# Patient Record
Sex: Female | Born: 2000
Health system: Southern US, Community
[De-identification: ages and names within clinical notes are randomized; demographics above are authoritative.]

## PROBLEM LIST (undated history)

## (undated) DIAGNOSIS — Z789 Other specified health status: Secondary | ICD-10-CM

## (undated) HISTORY — DX: Other specified health status: Z78.9

## (undated) HISTORY — PX: NO PAST SURGERIES: SHX2092

---
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
## 2018-01-23 ENCOUNTER — Telehealth: Payer: Self-pay | Admitting: Obstetrics & Gynecology

## 2018-01-23 NOTE — Telephone Encounter (Signed)
Patient states she is not spotting and wants to know if that is normal. Informed patient that no spotting if good. Verbalized understanding.

## 2018-01-29 ENCOUNTER — Telehealth: Payer: Self-pay | Admitting: *Deleted

## 2018-01-29 NOTE — Telephone Encounter (Signed)
Patient asking if she can take tylenol. Informed that is fine. Also states she is vomiting up yellow stuff. Informed patient that is bile acids from her stomach probably due to the fact her stomach is empty.  Verbalized understanding.

## 2018-02-07 ENCOUNTER — Encounter: Payer: Self-pay | Admitting: Adult Health

## 2018-02-07 ENCOUNTER — Other Ambulatory Visit: Payer: Self-pay

## 2018-02-07 ENCOUNTER — Ambulatory Visit (INDEPENDENT_AMBULATORY_CARE_PROVIDER_SITE_OTHER): Payer: BLUE CROSS/BLUE SHIELD | Admitting: Adult Health

## 2018-02-07 VITALS — BP 98/64 | HR 87 | Ht 63.0 in | Wt 110.0 lb

## 2018-02-07 DIAGNOSIS — Z3201 Encounter for pregnancy test, result positive: Secondary | ICD-10-CM | POA: Diagnosis not present

## 2018-02-07 DIAGNOSIS — Z3A09 9 weeks gestation of pregnancy: Secondary | ICD-10-CM

## 2018-02-07 DIAGNOSIS — N926 Irregular menstruation, unspecified: Secondary | ICD-10-CM | POA: Diagnosis not present

## 2018-02-07 DIAGNOSIS — O3680X0 Pregnancy with inconclusive fetal viability, not applicable or unspecified: Secondary | ICD-10-CM | POA: Insufficient documentation

## 2018-02-07 LAB — POCT URINE PREGNANCY: PREG TEST UR: POSITIVE — AB

## 2018-02-07 MED ORDER — PRENATAL PLUS IRON 29-1 MG PO TABS: ORAL_TABLET | ORAL | 12 refills | Status: DC

## 2018-02-07 NOTE — Progress Notes (Signed)
Subjective:     Patient ID: Lindsay Blackwell, female   DOB: 2000/12/11, 17 y.o.   MRN: [id number]  HPI Lindsay Blackwell is a 17 year old Hispanic female in for UPT, has missed a period.   Review of Systems +missed period Reviewed past medical,surgical, social and family history. Reviewed medications and allergies.     Objective:   Physical Exam BP 98/64 (BP Location: Right Arm, Patient Position: Sitting, Cuff Size: Normal)   Pulse 87   Ht 5\' 3"  (1.6 m)   Wt 110 lb (49.9 kg)   LMP 12/05/2017   BMI 19.49 kg/m UPT +, about 9+1 week by LMP with EDD 09/11/18.Skin warm and dry. Neck: mid line trachea, normal thyroid, good ROM, no lymphadenopathy noted. Lungs: clear to ausculation bilaterally. Cardiovascular: regular rate and rhythm.   Abdomen is soft and non tender, pt aware babies delivered at [facility] and about after hours call service.   Assessment:     1. Positive pregnancy test   2. [redacted] weeks gestation of pregnancy   3. Encounter to determine fetal viability of pregnancy, single or unspecified fetus       Plan:      Meds ordered this encounter  Medications  . Prenatal Vit-Iron Carbonyl-FA (PRENATAL PLUS IRON) 29-1 MG TABS    Sig: Take daily    Dispense:  30 tablet    Refill:  12    Order Specific Question:   Supervising Provider    Answer:   Florian Buff [[id number]]  Return in 1 week for dating [place] Review handouts on First trimester and by [facility]

## 2018-02-07 NOTE — Patient Instructions (Signed)
First Trimester of Pregnancy The first trimester of pregnancy is from week 1 until the end of week 13 (months 1 through 3). A week after a sperm fertilizes an egg, the egg will implant on the wall of the uterus. This embryo will begin to develop into a baby. Genes from you and your partner will form the baby. The female genes will determine whether the baby will be a boy or a girl. At 6-8 weeks, the eyes and face will be formed, and the heartbeat can be seen on ultrasound. At the end of 12 weeks, all the baby's organs will be formed. Now that you are pregnant, you will want to do everything you can to have a healthy baby. Two of the most important things are to get good prenatal care and to follow your health care provider's instructions. Prenatal care is all the medical care you receive before the baby's birth. This care will help prevent, find, and treat any problems during the pregnancy and childbirth. Body changes during your first trimester Your body goes through many changes during pregnancy. The changes vary from woman to woman.  You may gain or lose a couple of pounds at first.  You may feel sick to your stomach (nauseous) and you may throw up (vomit). If the vomiting is uncontrollable, call your health care provider.  You may tire easily.  You may develop headaches that can be relieved by medicines. All medicines should be approved by your health care provider.  You may urinate more often. Painful urination may mean you have a bladder infection.  You may develop heartburn as a result of your pregnancy.  You may develop constipation because certain hormones are causing the muscles that push stool through your intestines to slow down.  You may develop hemorrhoids or swollen veins (varicose veins).  Your breasts may begin to grow larger and become tender. Your nipples may stick out more, and the tissue that surrounds them (areola) may become darker.  Your gums may bleed and may be  sensitive to brushing and flossing.  Dark spots or blotches (chloasma, mask of pregnancy) may develop on your face. This will likely fade after the baby is born.  Your menstrual periods will stop.  You may have a loss of appetite.  You may develop cravings for certain kinds of food.  You may have changes in your emotions from day to day, such as being excited to be pregnant or being concerned that something may go wrong with the pregnancy and baby.  You may have more vivid and strange dreams.  You may have changes in your hair. These can include thickening of your hair, rapid growth, and changes in texture. Some women also have hair loss during or after pregnancy, or hair that feels dry or thin. Your hair will most likely return to normal after your baby is born.  What to expect at prenatal visits During a routine prenatal visit:  You will be weighed to make sure you and the baby are growing normally.  Your blood pressure will be taken.  Your abdomen will be measured to track your baby's growth.  The fetal heartbeat will be listened to between weeks 10 and 14 of your pregnancy.  Test results from any previous visits will be discussed.  Your health care provider may ask you:  How you are feeling.  If you are feeling the baby move.  If you have had any abnormal symptoms, such as leaking fluid, bleeding, severe headaches,   or abdominal cramping.  If you are using any tobacco products, including cigarettes, chewing tobacco, and electronic cigarettes.  If you have any questions.  Other tests that may be performed during your first trimester include:  Blood tests to find your blood type and to check for the presence of any previous infections. The tests will also be used to check for low iron levels (anemia) and protein on red blood cells (Rh antibodies). Depending on your risk factors, or if you previously had diabetes during pregnancy, you may have tests to check for high blood  sugar that affects pregnant women (gestational diabetes).  Urine tests to check for infections, diabetes, or protein in the urine.  An ultrasound to confirm the proper growth and development of the baby.  Fetal screens for spinal cord problems (spina bifida) and Down syndrome.  HIV (human immunodeficiency virus) testing. Routine prenatal testing includes screening for HIV, unless you choose not to have this test.  You may need other tests to make sure you and the baby are doing well.  Follow these instructions at home: Medicines  Follow your health care provider's instructions regarding medicine use. Specific medicines may be either safe or unsafe to take during pregnancy.  Take a prenatal vitamin that contains at least 600 micrograms (mcg) of folic acid.  If you develop constipation, try taking a stool softener if your health care provider approves. Eating and drinking  Eat a balanced diet that includes fresh fruits and vegetables, whole grains, good sources of protein such as meat, eggs, or tofu, and low-fat dairy. Your health care provider will help you determine the amount of weight gain that is right for you.  Avoid raw meat and uncooked cheese. These carry germs that can cause birth defects in the baby.  Eating four or five small meals rather than three large meals a day may help relieve nausea and vomiting. If you start to feel nauseous, eating a few soda crackers can be helpful. Drinking liquids between meals, instead of during meals, also seems to help ease nausea and vomiting.  Limit foods that are high in fat and processed sugars, such as fried and sweet foods.  To prevent constipation: ? Eat foods that are high in fiber, such as fresh fruits and vegetables, whole grains, and beans. ? Drink enough fluid to keep your urine clear or pale yellow. Activity  Exercise only as directed by your health care provider. Most women can continue their usual exercise routine during  pregnancy. Try to exercise for 30 minutes at least 5 days a week. Exercising will help you: ? Control your weight. ? Stay in shape. ? Be prepared for labor and delivery.  Experiencing pain or cramping in the lower abdomen or lower back is a good sign that you should stop exercising. Check with your health care provider before continuing with normal exercises.  Try to avoid standing for long periods of time. Move your legs often if you must stand in one place for a long time.  Avoid heavy lifting.  Wear low-heeled shoes and practice good posture.  You may continue to have sex unless your health care provider tells you not to. Relieving pain and discomfort  Wear a good support bra to relieve breast tenderness.  Take warm sitz baths to soothe any pain or discomfort caused by hemorrhoids. Use hemorrhoid cream if your health care provider approves.  Rest with your legs elevated if you have leg cramps or low back pain.  If you develop   varicose veins in your legs, wear support hose. Elevate your feet for 15 minutes, 3-4 times a day. Limit salt in your diet. Prenatal care  Schedule your prenatal visits by the twelfth week of pregnancy. They are usually scheduled monthly at first, then more often in the last 2 months before delivery.  Write down your questions. Take them to your prenatal visits.  Keep all your prenatal visits as told by your health care provider. This is important. Safety  Wear your seat belt at all times when driving.  Make a list of emergency phone numbers, including numbers for family, friends, the hospital, and police and fire departments. General instructions  Ask your health care provider for a referral to a local prenatal education class. Begin classes no later than the beginning of month 6 of your pregnancy.  Ask for help if you have counseling or nutritional needs during pregnancy. Your health care provider can offer advice or refer you to specialists for help  with various needs.  Do not use hot tubs, steam rooms, or saunas.  Do not douche or use tampons or scented sanitary pads.  Do not cross your legs for long periods of time.  Avoid cat litter boxes and soil used by cats. These carry germs that can cause birth defects in the baby and possibly loss of the fetus by miscarriage or stillbirth.  Avoid all smoking, herbs, alcohol, and medicines not prescribed by your health care provider. Chemicals in these products affect the formation and growth of the baby.  Do not use any products that contain nicotine or tobacco, such as cigarettes and e-cigarettes. If you need help quitting, ask your health care provider. You may receive counseling support and other resources to help you quit.  Schedule a dentist appointment. At home, brush your teeth with a soft toothbrush and be gentle when you floss. Contact a health care provider if:  You have dizziness.  You have mild pelvic cramps, pelvic pressure, or nagging pain in the abdominal area.  You have persistent nausea, vomiting, or diarrhea.  You have a bad smelling vaginal discharge.  You have pain when you urinate.  You notice increased swelling in your face, hands, legs, or ankles.  You are exposed to fifth disease or chickenpox.  You are exposed to German measles (rubella) and have never had it. Get help right away if:  You have a fever.  You are leaking fluid from your vagina.  You have spotting or bleeding from your vagina.  You have severe abdominal cramping or pain.  You have rapid weight gain or loss.  You vomit blood or material that looks like coffee grounds.  You develop a severe headache.  You have shortness of breath.  You have any kind of trauma, such as from a fall or a car accident. Summary  The first trimester of pregnancy is from week 1 until the end of week 13 (months 1 through 3).  Your body goes through many changes during pregnancy. The changes vary from  woman to woman.  You will have routine prenatal visits. During those visits, your health care provider will examine you, discuss any test results you may have, and talk with you about how you are feeling. This information is not intended to replace advice given to you by your health care provider. Make sure you discuss any questions you have with your health care provider. Document Released: 10/16/2001 Document Revised: 10/03/2016 Document Reviewed: 10/03/2016 Elsevier Interactive Patient Education  2018 [employer]   Inc[employer]  

## 2018-02-17 ENCOUNTER — Ambulatory Visit (INDEPENDENT_AMBULATORY_CARE_PROVIDER_SITE_OTHER): Payer: BLUE CROSS/BLUE SHIELD

## 2018-02-17 DIAGNOSIS — Z3A10 10 weeks gestation of pregnancy: Secondary | ICD-10-CM | POA: Diagnosis not present

## 2018-02-17 DIAGNOSIS — O3680X0 Pregnancy with inconclusive fetal viability, not applicable or unspecified: Secondary | ICD-10-CM

## 2018-02-17 NOTE — Progress Notes (Signed)
[place] 10+4 wks,single IUP w/ys,positive fht 166 bpm,normal ovaries bilat,crl 33.75 mm,EDD 09/11/2018 LMP

## 2018-03-03 ENCOUNTER — Encounter: Payer: Self-pay | Admitting: Women's Health

## 2018-03-03 ENCOUNTER — Ambulatory Visit: Payer: BLUE CROSS/BLUE SHIELD | Admitting: *Deleted

## 2018-03-03 ENCOUNTER — Ambulatory Visit (INDEPENDENT_AMBULATORY_CARE_PROVIDER_SITE_OTHER): Payer: BLUE CROSS/BLUE SHIELD | Admitting: Women's Health

## 2018-03-03 VITALS — BP 108/70 | HR 95 | Wt 111.0 lb

## 2018-03-03 DIAGNOSIS — O9989 Other specified diseases and conditions complicating pregnancy, childbirth and the puerperium: Secondary | ICD-10-CM

## 2018-03-03 DIAGNOSIS — R110 Nausea: Secondary | ICD-10-CM | POA: Diagnosis not present

## 2018-03-03 DIAGNOSIS — Z3401 Encounter for supervision of normal first pregnancy, first trimester: Secondary | ICD-10-CM

## 2018-03-03 DIAGNOSIS — Z3A12 12 weeks gestation of pregnancy: Secondary | ICD-10-CM | POA: Diagnosis not present

## 2018-03-03 DIAGNOSIS — Z1389 Encounter for screening for other disorder: Secondary | ICD-10-CM

## 2018-03-03 DIAGNOSIS — Z3400 Encounter for supervision of normal first pregnancy, unspecified trimester: Secondary | ICD-10-CM | POA: Insufficient documentation

## 2018-03-03 DIAGNOSIS — Z3682 Encounter for antenatal screening for nuchal translucency: Secondary | ICD-10-CM

## 2018-03-03 DIAGNOSIS — Z331 Pregnant state, incidental: Secondary | ICD-10-CM

## 2018-03-03 LAB — POCT URINALYSIS DIPSTICK
Glucose, UA: NEGATIVE
Leukocytes, UA: NEGATIVE
NITRITE UA: NEGATIVE
PROTEIN UA: NEGATIVE
RBC UA: NEGATIVE

## 2018-03-03 MED ORDER — PROMETHAZINE HCL 25 MG PO TABS: 12.5000 mg | ORAL_TABLET | Freq: Four times a day (QID) | ORAL | 0 refills | Status: DC | PRN

## 2018-03-03 NOTE — Addendum Note (Signed)
Addended by: Roma Schanz on: 03/03/2018 12:47 PM   Modules accepted: Orders

## 2018-03-03 NOTE — Progress Notes (Addendum)
INITIAL OBSTETRICAL VISIT Patient name: Lindsay Blackwell MRN [id number]  Date of birth: September 10, 2001 Chief Complaint:   Initial Prenatal Visit (nausea)  History of Present Illness:   Lindsay Blackwell is a 17 y.o. G73P0000 Hispanic female at [redacted]w[redacted]d by LMP c/w 10wk u/s, with an Estimated Date of Delivery: 09/11/18 being seen today for her initial obstetrical visit.   Her obstetrical history is significant for primigravida.   Today she reports occ nausea, requests prn med.  Patient's last menstrual period was 12/05/2017. Last pap <21yo. Results were: n/a Review of Systems:   Pertinent items are noted in HPI Denies cramping/contractions, leakage of fluid, vaginal bleeding, abnormal vaginal discharge w/ itching/odor/irritation, headaches, visual changes, shortness of breath, chest pain, abdominal pain, severe nausea/vomiting, or problems with urination or bowel movements unless otherwise stated above.  Pertinent History Reviewed:  Reviewed past medical,surgical, social, obstetrical and family history.  Reviewed problem list, medications and allergies. OB History  Gravida Para Term Preterm AB Living  1 0 0 0 0 0  SAB TAB Ectopic Multiple Live Births  0 0 0 0 0    # Outcome Date GA Lbr Len/2nd Weight Sex Delivery Anes PTL Lv  1 Current            Physical Assessment:   Vitals:   03/03/18 1124  BP: 108/70  Pulse: 95  Weight: 111 lb (50.3 kg)  Body mass index is 19.66 kg/m.       Physical Examination:  General appearance - well appearing, and in no distress  Mental status - alert, oriented to person, place, and time  Psych:  She has a normal mood and affect  Skin - warm and dry, normal color, no suspicious lesions noted  Chest - effort normal, all lung fields clear to auscultation bilaterally  Heart - normal rate and regular rhythm  Abdomen - soft, nontender  Extremities:  No swelling or varicosities noted  Thin prep pap is not done  Fetal Heart Rate (bpm): 156 via  doppler  Results for orders placed or performed in visit on 03/03/18 (from the past 24 hour(s))  POCT urinalysis dipstick   Collection Time: 03/03/18 11:38 AM  Result Value Ref Range   Color, UA     Clarity, UA     Glucose, UA neg    Bilirubin, UA     Ketones, UA large    Spec Grav, UA  1.010 - 1.025   Blood, UA neg    pH, UA  5.0 - 8.0   Protein, UA neg    Urobilinogen, UA  0.2 or 1.0 E.U./dL   Nitrite, UA neg    Leukocytes, UA Negative Negative   Appearance     Odor      Assessment & Plan:  1) Low-Risk Pregnancy G1P0000 at [redacted]w[redacted]d with an Estimated Date of Delivery: 09/11/18   2) Initial OB visit  3) Nausea> requested prn med, rx phenergan  Meds:  Meds ordered this encounter  Medications  . promethazine (PHENERGAN) 25 MG tablet    Sig: Take 0.5-1 tablets (12.5-25 mg total) by mouth every 6 (six) hours as needed for nausea or vomiting.    Dispense:  30 tablet    Refill:  0    Order Specific Question:   Supervising Provider    Answer:   Florian Buff [[id number]]    Initial labs obtained Continue prenatal vitamins Reviewed n/v relief measures and warning s/s to report Reviewed recommended weight gain based on pre-gravid BMI  Encouraged well-balanced diet Genetic Screening discussed Integrated Screen: requested Cystic fibrosis screening discussed requested Ultrasound discussed; fetal survey: requested CCNC completed>spoke w/ PCM  Follow-up: Return for Thurs or Friday for 1st nt/it (no visit), then 4wks for LROB and 2nd IT.   Orders Placed This Encounter  Procedures  . Urine Culture  . GC/Chlamydia Probe Amp  . US Fetal Nuchal Translucency Measurement  . Obstetric Panel, Including HIV  . Urinalysis, Routine w reflex microscopic  . Sickle cell screen  . Pain Management Screening Profile (10S)  . Cystic Fibrosis Mutation 97  . POCT urinalysis dipstick    Roma Schanz CNM, [facility] 03/03/2018 12:47 PM

## 2018-03-03 NOTE — Patient Instructions (Signed)
Lindsay Blackwell, I greatly value your feedback.  If you receive a survey following your visit with [place] today, we appreciate you taking the time to fill it out.  Thanks, Knute Neu, CNM, WHNP-BC   Nausea & Vomiting  Have saltine crackers or pretzels by your bed and eat a few bites before you raise your head out of bed in the morning  Eat small frequent meals throughout the day instead of large meals  Drink plenty of fluids throughout the day to stay hydrated, just don't drink a lot of fluids with your meals.  This can make your stomach fill up faster making you feel sick  Do not brush your teeth right after you eat  Products with real ginger are good for nausea, like ginger ale and ginger hard candy Make sure it says made with real ginger!  Sucking on sour candy like lemon heads is also good for nausea  If your prenatal vitamins make you nauseated, take them at night so you will sleep through the nausea  Sea Bands  If you feel like you need medicine for the nausea & vomiting please let us know  If you are unable to keep any fluids or food down please let us know   Constipation  Drink plenty of fluid, preferably water, throughout the day  Eat foods high in fiber such as fruits, vegetables, and grains  Exercise, such as walking, is a good way to keep your bowels regular  Drink warm fluids, especially warm prune juice, or decaf coffee  Eat a 1/2 cup of real oatmeal (not instant), 1/2 cup applesauce, and 1/2-1 cup warm prune juice every day  If needed, you may take Colace (docusate sodium) stool softener once or twice a day to help keep the stool soft. If you are pregnant, wait until you are out of your first trimester (12-14 weeks of pregnancy)  If you still are having problems with constipation, you may take Miralax once daily as needed to help keep your bowels regular.  If you are pregnant, wait until you are out of your first trimester (12-14 weeks of pregnancy)   First  Trimester of Pregnancy The first trimester of pregnancy is from week 1 until the end of week 12 (months 1 through 3). A week after a sperm fertilizes an egg, the egg will implant on the wall of the uterus. This embryo will begin to develop into a baby. Genes from you and your partner are forming the baby. The female genes determine whether the baby is a boy or a girl. At 6-8 weeks, the eyes and face are formed, and the heartbeat can be seen on ultrasound. At the end of 12 weeks, all the baby's organs are formed.  Now that you are pregnant, you will want to do everything you can to have a healthy baby. Two of the most important things are to get good prenatal care and to follow your health care provider's instructions. Prenatal care is all the medical care you receive before the baby's birth. This care will help prevent, find, and treat any problems during the pregnancy and childbirth. BODY CHANGES Your body goes through many changes during pregnancy. The changes vary from woman to woman.   You may gain or lose a couple of pounds at first.  You may feel sick to your stomach (nauseous) and throw up (vomit). If the vomiting is uncontrollable, call your health care provider.  You may tire easily.  You may develop headaches  that can be relieved by medicines approved by your health care provider.  You may urinate more often. Painful urination may mean you have a bladder infection.  You may develop heartburn as a result of your pregnancy.  You may develop constipation because certain hormones are causing the muscles that push waste through your intestines to slow down.  You may develop hemorrhoids or swollen, bulging veins (varicose veins).  Your breasts may begin to grow larger and become tender. Your nipples may stick out more, and the tissue that surrounds them (areola) may become darker.  Your gums may bleed and may be sensitive to brushing and flossing.  Dark spots or blotches (chloasma, mask  of pregnancy) may develop on your face. This will likely fade after the baby is born.  Your menstrual periods will stop.  You may have a loss of appetite.  You may develop cravings for certain kinds of food.  You may have changes in your emotions from day to day, such as being excited to be pregnant or being concerned that something may go wrong with the pregnancy and baby.  You may have more vivid and strange dreams.  You may have changes in your hair. These can include thickening of your hair, rapid growth, and changes in texture. Some women also have hair loss during or after pregnancy, or hair that feels dry or thin. Your hair will most likely return to normal after your baby is born. WHAT TO EXPECT AT YOUR PRENATAL VISITS During a routine prenatal visit:  You will be weighed to make sure you and the baby are growing normally.  Your blood pressure will be taken.  Your abdomen will be measured to track your baby's growth.  The fetal heartbeat will be listened to starting around week 10 or 12 of your pregnancy.  Test results from any previous visits will be discussed. Your health care provider may ask you:  How you are feeling.  If you are feeling the baby move.  If you have had any abnormal symptoms, such as leaking fluid, bleeding, severe headaches, or abdominal cramping.  If you have any questions. Other tests that may be performed during your first trimester include:  Blood tests to find your blood type and to check for the presence of any previous infections. They will also be used to check for low iron levels (anemia) and Rh antibodies. Later in the pregnancy, blood tests for diabetes will be done along with other tests if problems develop.  Urine tests to check for infections, diabetes, or protein in the urine.  An ultrasound to confirm the proper growth and development of the baby.  An amniocentesis to check for possible genetic problems.  Fetal screens for spina  bifida and Down syndrome.  You may need other tests to make sure you and the baby are doing well. HOME CARE INSTRUCTIONS  Medicines  Follow your health care provider's instructions regarding medicine use. Specific medicines may be either safe or unsafe to take during pregnancy.  Take your prenatal vitamins as directed.  If you develop constipation, try taking a stool softener if your health care provider approves. Diet  Eat regular, well-balanced meals. Choose a variety of foods, such as meat or vegetable-based protein, fish, milk and low-fat dairy products, vegetables, fruits, and whole grain breads and cereals. Your health care provider will help you determine the amount of weight gain that is right for you.  Avoid raw meat and uncooked cheese. These carry germs that can  cause birth defects in the baby.  Eating four or five small meals rather than three large meals a day may help relieve nausea and vomiting. If you start to feel nauseous, eating a few soda crackers can be helpful. Drinking liquids between meals instead of during meals also seems to help nausea and vomiting.  If you develop constipation, eat more high-fiber foods, such as fresh vegetables or fruit and whole grains. Drink enough fluids to keep your urine clear or pale yellow. Activity and Exercise  Exercise only as directed by your health care provider. Exercising will help you:  Control your weight.  Stay in shape.  Be prepared for labor and delivery.  Experiencing pain or cramping in the lower abdomen or low back is a good sign that you should stop exercising. Check with your health care provider before continuing normal exercises.  Try to avoid standing for long periods of time. Move your legs often if you must stand in one place for a long time.  Avoid heavy lifting.  Wear low-heeled shoes, and practice good posture.  You may continue to have sex unless your health care provider directs you  otherwise. Relief of Pain or Discomfort  Wear a good support bra for breast tenderness.   Take warm sitz baths to soothe any pain or discomfort caused by hemorrhoids. Use hemorrhoid cream if your health care provider approves.   Rest with your legs elevated if you have leg cramps or low back pain.  If you develop varicose veins in your legs, wear support hose. Elevate your feet for 15 minutes, 3-4 times a day. Limit salt in your diet. Prenatal Care  Schedule your prenatal visits by the twelfth week of pregnancy. They are usually scheduled monthly at first, then more often in the last 2 months before delivery.  Write down your questions. Take them to your prenatal visits.  Keep all your prenatal visits as directed by your health care provider. Safety  Wear your seat belt at all times when driving.  Make a list of emergency phone numbers, including numbers for family, friends, the hospital, and police and fire departments. General Tips  Ask your health care provider for a referral to a local prenatal education class. Begin classes no later than at the beginning of month 6 of your pregnancy.  Ask for help if you have counseling or nutritional needs during pregnancy. Your health care provider can offer advice or refer you to specialists for help with various needs.  Do not use hot tubs, steam rooms, or saunas.  Do not douche or use tampons or scented sanitary pads.  Do not cross your legs for long periods of time.  Avoid cat litter boxes and soil used by cats. These carry germs that can cause birth defects in the baby and possibly loss of the fetus by miscarriage or stillbirth.  Avoid all smoking, herbs, alcohol, and medicines not prescribed by your health care provider. Chemicals in these affect the formation and growth of the baby.  Schedule a dentist appointment. At home, brush your teeth with a soft toothbrush and be gentle when you floss. SEEK MEDICAL CARE IF:   You have  dizziness.  You have mild pelvic cramps, pelvic pressure, or nagging pain in the abdominal area.  You have persistent nausea, vomiting, or diarrhea.  You have a bad smelling vaginal discharge.  You have pain with urination.  You notice increased swelling in your face, hands, legs, or ankles. SEEK IMMEDIATE MEDICAL CARE IF:  You have a fever.  You are leaking fluid from your vagina.  You have spotting or bleeding from your vagina.  You have severe abdominal cramping or pain.  You have rapid weight gain or loss.  You vomit blood or material that looks like coffee grounds.  You are exposed to [place] measles and have never had them.  You are exposed to fifth disease or chickenpox.  You develop a severe headache.  You have shortness of breath.  You have any kind of trauma, such as from a fall or a car accident. Document Released: 10/16/2001 Document Revised: 03/08/2014 Document Reviewed: 09/01/2013 [facility] Patient Information 2015 [place], [place]. This information is not intended to replace advice given to you by your health care provider. Make sure you discuss any questions you have with your health care provider.

## 2018-03-04 LAB — URINALYSIS, ROUTINE W REFLEX MICROSCOPIC
Bilirubin, UA: NEGATIVE
Glucose, UA: NEGATIVE
Nitrite, UA: NEGATIVE
Protein, UA: NEGATIVE
RBC, UA: NEGATIVE
SPEC GRAV UA: 1.029 (ref 1.005–1.030)
Urobilinogen, Ur: 1 mg/dL (ref 0.2–1.0)
pH, UA: 6 (ref 5.0–7.5)

## 2018-03-04 LAB — OBSTETRIC PANEL, INCLUDING HIV
ANTIBODY SCREEN: NEGATIVE
BASOS: 0 %
Basophils Absolute: 0 10*3/uL (ref 0.0–0.2)
EOS (ABSOLUTE): 0 10*3/uL (ref 0.0–0.4)
Eos: 1 %
HIV SCREEN 4TH GENERATION: NONREACTIVE
Hematocrit: 37.9 % (ref 34.0–46.6)
Hemoglobin: 12.7 g/dL (ref 11.1–15.9)
Hepatitis B Surface Ag: NEGATIVE
Immature Grans (Abs): 0 10*3/uL (ref 0.0–0.1)
Immature Granulocytes: 0 %
Lymphocytes Absolute: 2.1 10*3/uL (ref 0.7–3.1)
Lymphs: 27 %
MCH: 29.3 pg (ref 26.6–33.0)
MCHC: 33.5 g/dL (ref 31.5–35.7)
MCV: 88 fL (ref 79–97)
MONOS ABS: 0.5 10*3/uL (ref 0.1–0.9)
Monocytes: 6 %
NEUTROS ABS: 4.9 10*3/uL (ref 1.4–7.0)
Neutrophils: 66 %
Platelets: 179 10*3/uL (ref 150–379)
RBC: 4.33 x10E6/uL (ref 3.77–5.28)
RDW: 14 % (ref 12.3–15.4)
RPR Ser Ql: NONREACTIVE
RUBELLA: 3.37 {index} (ref >0.99)
Rh Factor: POSITIVE
WBC: 7.5 10*3/uL (ref 3.4–10.8)

## 2018-03-04 LAB — MICROSCOPIC EXAMINATION
Casts: NONE SEEN /lpf
Epithelial Cells (non renal): >10 /hpf — AB (ref 0–10)
WBC, UA: >30 /hpf — AB (ref 0–5)

## 2018-03-04 LAB — SICKLE CELL SCREEN: Sickle Cell Screen: NEGATIVE

## 2018-03-06 ENCOUNTER — Ambulatory Visit (INDEPENDENT_AMBULATORY_CARE_PROVIDER_SITE_OTHER): Payer: BLUE CROSS/BLUE SHIELD

## 2018-03-06 ENCOUNTER — Other Ambulatory Visit: Payer: BLUE CROSS/BLUE SHIELD

## 2018-03-06 DIAGNOSIS — Z3682 Encounter for antenatal screening for nuchal translucency: Secondary | ICD-10-CM

## 2018-03-06 DIAGNOSIS — Z3A13 13 weeks gestation of pregnancy: Secondary | ICD-10-CM | POA: Diagnosis not present

## 2018-03-06 DIAGNOSIS — Z3401 Encounter for supervision of normal first pregnancy, first trimester: Secondary | ICD-10-CM

## 2018-03-06 NOTE — Progress Notes (Signed)
[place] 13 wks,measurements c/w dates,crl 65.51 mm,fhr 153 bpm,anterior pl gr 0,normal ovaries bilat,NB present,NT 1.2 mm

## 2018-03-07 ENCOUNTER — Other Ambulatory Visit: Payer: Self-pay | Admitting: Women's Health

## 2018-03-07 DIAGNOSIS — O9989 Other specified diseases and conditions complicating pregnancy, childbirth and the puerperium: Principal | ICD-10-CM

## 2018-03-07 DIAGNOSIS — R8271 Bacteriuria: Secondary | ICD-10-CM

## 2018-03-07 DIAGNOSIS — O99891 Other specified diseases and conditions complicating pregnancy: Secondary | ICD-10-CM | POA: Insufficient documentation

## 2018-03-07 LAB — URINE CULTURE

## 2018-03-07 MED ORDER — CEPHALEXIN 500 MG PO CAPS: 500.0000 mg | ORAL_CAPSULE | Freq: Four times a day (QID) | ORAL | 0 refills | Status: DC

## 2018-03-08 LAB — INTEGRATED 1
Crown Rump Length: 68.5 mm
Gest. Age on Collection Date: 12.9 weeks
Maternal Age at EDD: 19.3 yr
Nuchal Translucency (NT): 1.2 mm
Number of Fetuses: 1
PAPP-A VALUE: 2489.8 ng/mL
Weight: 111 [lb_av]

## 2018-03-10 ENCOUNTER — Other Ambulatory Visit: Payer: BLUE CROSS/BLUE SHIELD

## 2018-03-10 ENCOUNTER — Encounter: Payer: BLUE CROSS/BLUE SHIELD | Admitting: Women's Health

## 2018-03-10 LAB — CYSTIC FIBROSIS MUTATION 97: Interpretation: NOT DETECTED

## 2018-03-11 ENCOUNTER — Telehealth: Payer: Self-pay | Admitting: *Deleted

## 2018-03-11 NOTE — Telephone Encounter (Signed)
Informed patient urine culture showed UTI so Kim sent in prescription for Keflex.  Advised to take all of medication. Verbalized understanding.

## 2018-04-01 ENCOUNTER — Encounter: Payer: Self-pay | Admitting: Women's Health

## 2018-04-01 ENCOUNTER — Ambulatory Visit (INDEPENDENT_AMBULATORY_CARE_PROVIDER_SITE_OTHER): Payer: BLUE CROSS/BLUE SHIELD | Admitting: Women's Health

## 2018-04-01 VITALS — BP 110/60 | HR 87 | Wt 111.8 lb

## 2018-04-01 DIAGNOSIS — Z3A16 16 weeks gestation of pregnancy: Secondary | ICD-10-CM

## 2018-04-01 DIAGNOSIS — O9989 Other specified diseases and conditions complicating pregnancy, childbirth and the puerperium: Secondary | ICD-10-CM

## 2018-04-01 DIAGNOSIS — Z3402 Encounter for supervision of normal first pregnancy, second trimester: Secondary | ICD-10-CM

## 2018-04-01 DIAGNOSIS — Z1389 Encounter for screening for other disorder: Secondary | ICD-10-CM

## 2018-04-01 DIAGNOSIS — Z363 Encounter for antenatal screening for malformations: Secondary | ICD-10-CM

## 2018-04-01 DIAGNOSIS — Z1379 Encounter for other screening for genetic and chromosomal anomalies: Secondary | ICD-10-CM

## 2018-04-01 DIAGNOSIS — Z331 Pregnant state, incidental: Secondary | ICD-10-CM

## 2018-04-01 DIAGNOSIS — R8271 Bacteriuria: Secondary | ICD-10-CM

## 2018-04-01 LAB — POCT URINALYSIS DIPSTICK
GLUCOSE UA: NEGATIVE
Ketones, UA: NEGATIVE
Nitrite, UA: NEGATIVE
Protein, UA: NEGATIVE
RBC UA: NEGATIVE

## 2018-04-01 NOTE — Progress Notes (Signed)
   LOW-RISK PREGNANCY VISIT Patient name: Lindsay Blackwell MRN [id number]  Date of birth: 2000-12-25 Chief Complaint:   Routine Prenatal Visit (2nd IT today; gag reflex; vomits up bile with blood)  History of Present Illness:   Lindsay Blackwell is a 17 y.o. G75P0000 female at [redacted]w[redacted]d with an Estimated Date of Delivery: 09/11/18 being seen today for ongoing management of a low-risk pregnancy.  Today she reports gums bleed, gags when brushing tongue, makes her vomit clear w/ blood-streaks. Doesn't floss.  . Vag. Bleeding: None.  Movement: Present. denies leaking of fluid. Review of Systems:   Pertinent items are noted in HPI Denies abnormal vaginal discharge w/ itching/odor/irritation, headaches, visual changes, shortness of breath, chest pain, abdominal pain, severe nausea/vomiting, or problems with urination or bowel movements unless otherwise stated above. Pertinent History Reviewed:  Reviewed past medical,surgical, social, obstetrical and family history.  Reviewed problem list, medications and allergies. Physical Assessment:   Vitals:   04/01/18 1004  BP: 110/60  Pulse: 87  Weight: 111 lb 12.8 oz (50.7 kg)  Body mass index is 19.8 kg/m.        Physical Examination:   General appearance: Well appearing, and in no distress  Mental status: Alert, oriented to person, place, and time  Skin: Warm & dry  Cardiovascular: Normal heart rate noted  Respiratory: Normal respiratory effort, no distress  Abdomen: Soft, gravid, nontender  Pelvic: Cervical exam deferred         Extremities: Edema: None  Fetal Status: Fetal Heart Rate (bpm): 144   Movement: Present    Results for orders placed or performed in visit on 04/01/18 (from the past 24 hour(s))  POCT urinalysis dipstick   Collection Time: 04/01/18 10:05 AM  Result Value Ref Range   Color, UA     Clarity, UA     Glucose, UA Negative Negative   Bilirubin, UA     Ketones, UA neg    Spec Grav, UA  1.010 - 1.025   Blood, UA neg      pH, UA  5.0 - 8.0   Protein, UA Negative Negative   Urobilinogen, UA  0.2 or 1.0 E.U./dL   Nitrite, UA neg    Leukocytes, UA Trace (A) Negative   Appearance     Odor      Assessment & Plan:  1) Low-risk pregnancy G1P0000 at [redacted]w[redacted]d with an Estimated Date of Delivery: 09/11/18   2) ASB, 1st trimester, poc today  3) Bleeding gums w/ brushing/gag reflex/vomits streaks of blood> to start flossing daily, see dentist if hasn't recently. Blood in vomit coming from gums   Meds: No orders of the defined types were placed in this encounter.  Labs/procedures today: 2nd IT  Plan:  Continue routine obstetrical care   Reviewed: Preterm labor symptoms and general obstetric precautions including but not limited to vaginal bleeding, contractions, leaking of fluid and fetal movement were reviewed in detail with the patient.  All questions were answered  Follow-up: Return in about 2 weeks (around 04/15/2018) for LROB, CH:ENIDPOE.  Orders Placed This Encounter  Procedures  . Urine Culture  . GC/Chlamydia Probe Amp  . US OB Comp + 14 Wk  . INTEGRATED 2  . Pain Management Screening Profile (10S)  . POCT urinalysis dipstick   Roma Schanz CNM, [facility] 04/01/2018 10:38 AM

## 2018-04-01 NOTE — Patient Instructions (Addendum)
Lindsay Blackwell, I greatly value your feedback.  If you receive a survey following your visit with [place] today, we appreciate you taking the time to fill it out.  Thanks, Knute Neu, CNM, WHNP-BC  For Dizzy Spells:   This is usually related to either your blood sugar or your blood pressure dropping  Make sure you are staying well hydrated and drinking enough water so that your urine is clear  Eat small frequent meals and snacks containing protein (meat, eggs, nuts, cheese) so that your blood sugar doesn't drop  If you do get dizzy, sit/lay down and get you something to drink and a snack containing protein- you will usually start feeling better in 10-20 minutes      Second Trimester of Pregnancy The second trimester is from week 14 through week 27 (months 4 through 6). The second trimester is often a time when you feel your best. Your body has adjusted to being pregnant, and you begin to feel better physically. Usually, morning sickness has lessened or quit completely, you may have more energy, and you may have an increase in appetite. The second trimester is also a time when the fetus is growing rapidly. At the end of the sixth month, the fetus is about 9 inches long and weighs about 1 pounds. You will likely begin to feel the baby move (quickening) between 16 and 20 weeks of pregnancy. Body changes during your second trimester Your body continues to go through many changes during your second trimester. The changes vary from woman to woman.  Your weight will continue to increase. You will notice your lower abdomen bulging out.  You may begin to get stretch marks on your hips, abdomen, and breasts.  You may develop headaches that can be relieved by medicines. The medicines should be approved by your health care provider.  You may urinate more often because the fetus is pressing on your bladder.  You may develop or continue to have heartburn as a result of your pregnancy.  You may  develop constipation because certain hormones are causing the muscles that push waste through your intestines to slow down.  You may develop hemorrhoids or swollen, bulging veins (varicose veins).  You may have back pain. This is caused by: ? Weight gain. ? Pregnancy hormones that are relaxing the joints in your pelvis. ? A shift in weight and the muscles that support your balance.  Your breasts will continue to grow and they will continue to become tender.  Your gums may bleed and may be sensitive to brushing and flossing.  Dark spots or blotches (chloasma, mask of pregnancy) may develop on your face. This will likely fade after the baby is born.  A dark line from your belly button to the pubic area (linea nigra) may appear. This will likely fade after the baby is born.  You may have changes in your hair. These can include thickening of your hair, rapid growth, and changes in texture. Some women also have hair loss during or after pregnancy, or hair that feels dry or thin. Your hair will most likely return to normal after your baby is born.  What to expect at prenatal visits During a routine prenatal visit:  You will be weighed to make sure you and the fetus are growing normally.  Your blood pressure will be taken.  Your abdomen will be measured to track your baby's growth.  The fetal heartbeat will be listened to.  Any test results from the previous  visit will be discussed.  Your health care provider may ask you:  How you are feeling.  If you are feeling the baby move.  If you have had any abnormal symptoms, such as leaking fluid, bleeding, severe headaches, or abdominal cramping.  If you are using any tobacco products, including cigarettes, chewing tobacco, and electronic cigarettes.  If you have any questions.  Other tests that may be performed during your second trimester include:  Blood tests that check for: ? Low iron levels (anemia). ? High blood sugar that  affects pregnant women (gestational diabetes) between 69 and 28 weeks. ? Rh antibodies. This is to check for a protein on red blood cells (Rh factor).  Urine tests to check for infections, diabetes, or protein in the urine.  An ultrasound to confirm the proper growth and development of the baby.  An amniocentesis to check for possible genetic problems.  Fetal screens for spina bifida and Down syndrome.  HIV (human immunodeficiency virus) testing. Routine prenatal testing includes screening for HIV, unless you choose not to have this test.  Follow these instructions at home: Medicines  Follow your health care provider's instructions regarding medicine use. Specific medicines may be either safe or unsafe to take during pregnancy.  Take a prenatal vitamin that contains at least 600 micrograms (mcg) of folic acid.  If you develop constipation, try taking a stool softener if your health care provider approves. Eating and drinking  Eat a balanced diet that includes fresh fruits and vegetables, whole grains, good sources of protein such as meat, eggs, or tofu, and low-fat dairy. Your health care provider will help you determine the amount of weight gain that is right for you.  Avoid raw meat and uncooked cheese. These carry germs that can cause birth defects in the baby.  If you have low calcium intake from food, talk to your health care provider about whether you should take a daily calcium supplement.  Limit foods that are high in fat and processed sugars, such as fried and sweet foods.  To prevent constipation: ? Drink enough fluid to keep your urine clear or pale yellow. ? Eat foods that are high in fiber, such as fresh fruits and vegetables, whole grains, and beans. Activity  Exercise only as directed by your health care provider. Most women can continue their usual exercise routine during pregnancy. Try to exercise for 30 minutes at least 5 days a week. Stop exercising if you  experience uterine contractions.  Avoid heavy lifting, wear low heel shoes, and practice good posture.  A sexual relationship may be continued unless your health care provider directs you otherwise. Relieving pain and discomfort  Wear a good support bra to prevent discomfort from breast tenderness.  Take warm sitz baths to soothe any pain or discomfort caused by hemorrhoids. Use hemorrhoid cream if your health care provider approves.  Rest with your legs elevated if you have leg cramps or low back pain.  If you develop varicose veins, wear support hose. Elevate your feet for 15 minutes, 3-4 times a day. Limit salt in your diet. Prenatal Care  Write down your questions. Take them to your prenatal visits.  Keep all your prenatal visits as told by your health care provider. This is important. Safety  Wear your seat belt at all times when driving.  Make a list of emergency phone numbers, including numbers for family, friends, the hospital, and police and fire departments. General instructions  Ask your health care provider for a  referral to a local prenatal education class. Begin classes no later than the beginning of month 6 of your pregnancy.  Ask for help if you have counseling or nutritional needs during pregnancy. Your health care provider can offer advice or refer you to specialists for help with various needs.  Do not use hot tubs, steam rooms, or saunas.  Do not douche or use tampons or scented sanitary pads.  Do not cross your legs for long periods of time.  Avoid cat litter boxes and soil used by cats. These carry germs that can cause birth defects in the baby and possibly loss of the fetus by miscarriage or stillbirth.  Avoid all smoking, herbs, alcohol, and unprescribed drugs. Chemicals in these products can affect the formation and growth of the baby.  Do not use any products that contain nicotine or tobacco, such as cigarettes and e-cigarettes. If you need help  quitting, ask your health care provider.  Visit your dentist if you have not gone yet during your pregnancy. Use a soft toothbrush to brush your teeth and be gentle when you floss. Contact a health care provider if:  You have dizziness.  You have mild pelvic cramps, pelvic pressure, or nagging pain in the abdominal area.  You have persistent nausea, vomiting, or diarrhea.  You have a bad smelling vaginal discharge.  You have pain when you urinate. Get help right away if:  You have a fever.  You are leaking fluid from your vagina.  You have spotting or bleeding from your vagina.  You have severe abdominal cramping or pain.  You have rapid weight gain or weight loss.  You have shortness of breath with chest pain.  You notice sudden or extreme swelling of your face, hands, ankles, feet, or legs.  You have not felt your baby move in over an hour.  You have severe headaches that do not go away when you take medicine.  You have vision changes. Summary  The second trimester is from week 14 through week 27 (months 4 through 6). It is also a time when the fetus is growing rapidly.  Your body goes through many changes during pregnancy. The changes vary from woman to woman.  Avoid all smoking, herbs, alcohol, and unprescribed drugs. These chemicals affect the formation and growth your baby.  Do not use any tobacco products, such as cigarettes, chewing tobacco, and e-cigarettes. If you need help quitting, ask your health care provider.  Contact your health care provider if you have any questions. Keep all prenatal visits as told by your health care provider. This is important. This information is not intended to replace advice given to you by your health care provider. Make sure you discuss any questions you have with your health care provider. Document Released: 10/16/2001 Document Revised: 03/29/2016 Document Reviewed: 12/23/2012 Elsevier Interactive Patient Education  2017  [employer].

## 2018-04-02 LAB — PMP SCREEN PROFILE (10S), URINE
Amphetamine Scrn, Ur: NEGATIVE ng/mL
BARBITURATE SCREEN URINE: NEGATIVE ng/mL
BENZODIAZEPINE SCREEN, URINE: NEGATIVE ng/mL
CANNABINOIDS UR QL SCN: NEGATIVE ng/mL
COCAINE(METAB.)SCREEN, URINE: NEGATIVE ng/mL
Creatinine(Crt), U: 223 mg/dL (ref 20.0–300.0)
METHADONE SCREEN, URINE: NEGATIVE ng/mL
OPIATE SCREEN URINE: NEGATIVE ng/mL
OXYCODONE+OXYMORPHONE UR QL SCN: NEGATIVE ng/mL
PHENCYCLIDINE QUANTITATIVE URINE: NEGATIVE ng/mL
PROPOXYPHENE SCREEN URINE: NEGATIVE ng/mL
Ph of Urine: 6.5 (ref 4.5–8.9)

## 2018-04-02 LAB — GC/CHLAMYDIA PROBE AMP
Chlamydia trachomatis, NAA: NEGATIVE
Neisseria gonorrhoeae by PCR: NEGATIVE

## 2018-04-03 LAB — INTEGRATED 2
ADSF: 1.88
AFP MARKER: 83.8 ng/mL
AFP MoM: 2.15
CROWN RUMP LENGTH: 68.5 mm
DIA MOM: 1.26
DIA Value: 260.7 pg/mL
ESTRIOL UNCONJUGATED: 1.85 ng/mL
GESTATIONAL AGE: 16.6 wk
Gest. Age on Collection Date: 12.9 weeks
Maternal Age at EDD: 19.3 yr
NUCHAL TRANSLUCENCY MOM: 0.78
NUMBER OF FETUSES: 1
Nuchal Translucency (NT): 1.2 mm
PAPP-A MoM: 1.65
PAPP-A VALUE: 2489.8 ng/mL
TEST RESULTS: NEGATIVE
WEIGHT: 112 [lb_av]
Weight: 111 [lb_av]
hCG MoM: 2.88
hCG Value: 110.2 IU/mL

## 2018-04-03 LAB — URINE CULTURE

## 2018-04-04 ENCOUNTER — Other Ambulatory Visit: Payer: Self-pay | Admitting: Women's Health

## 2018-04-04 DIAGNOSIS — R8271 Bacteriuria: Secondary | ICD-10-CM

## 2018-04-04 DIAGNOSIS — O9989 Other specified diseases and conditions complicating pregnancy, childbirth and the puerperium: Principal | ICD-10-CM

## 2018-04-04 MED ORDER — NITROFURANTOIN MONOHYD MACRO 100 MG PO CAPS: 100.0000 mg | ORAL_CAPSULE | Freq: Two times a day (BID) | ORAL | 0 refills | Status: DC

## 2018-04-04 NOTE — Progress Notes (Signed)
Called pt, notified urine cx still +, rx macrobid, take every pill as directed. Urine cx poc next visit.  Roma Schanz, CNM, [facility] 04/04/2018 2:39 PM

## 2018-04-24 ENCOUNTER — Ambulatory Visit (INDEPENDENT_AMBULATORY_CARE_PROVIDER_SITE_OTHER): Payer: BLUE CROSS/BLUE SHIELD

## 2018-04-24 ENCOUNTER — Encounter: Payer: BLUE CROSS/BLUE SHIELD | Admitting: Advanced Practice Midwife

## 2018-04-24 ENCOUNTER — Ambulatory Visit (INDEPENDENT_AMBULATORY_CARE_PROVIDER_SITE_OTHER): Payer: BLUE CROSS/BLUE SHIELD | Admitting: Obstetrics & Gynecology

## 2018-04-24 ENCOUNTER — Other Ambulatory Visit: Payer: Self-pay

## 2018-04-24 ENCOUNTER — Encounter: Payer: Self-pay | Admitting: Obstetrics & Gynecology

## 2018-04-24 VITALS — BP 108/71 | HR 82 | Wt 116.0 lb

## 2018-04-24 DIAGNOSIS — Z1389 Encounter for screening for other disorder: Secondary | ICD-10-CM

## 2018-04-24 DIAGNOSIS — Z363 Encounter for antenatal screening for malformations: Secondary | ICD-10-CM

## 2018-04-24 DIAGNOSIS — Z3A20 20 weeks gestation of pregnancy: Secondary | ICD-10-CM

## 2018-04-24 DIAGNOSIS — Z3402 Encounter for supervision of normal first pregnancy, second trimester: Secondary | ICD-10-CM

## 2018-04-24 DIAGNOSIS — Z87440 Personal history of urinary (tract) infections: Secondary | ICD-10-CM

## 2018-04-24 DIAGNOSIS — Z331 Pregnant state, incidental: Secondary | ICD-10-CM

## 2018-04-24 LAB — POCT URINALYSIS DIPSTICK
Glucose, UA: NEGATIVE
Ketones, UA: NEGATIVE
LEUKOCYTES UA: NEGATIVE
Nitrite, UA: NEGATIVE
Protein, UA: NEGATIVE
RBC UA: NEGATIVE

## 2018-04-24 NOTE — Progress Notes (Signed)
G1P0000 [redacted]w[redacted]d Estimated Date of Delivery: 09/11/18  Blood pressure 108/71, pulse 82, weight 116 lb (52.6 kg), last menstrual period 12/05/2017.   BP weight and urine results all reviewed and noted.  Please refer to the obstetrical flow sheet for the fundal height and fetal heart rate documentation:  Patient reports good fetal movement, denies any bleeding and no rupture of membranes symptoms or regular contractions. Patient is without complaints. All questions were answered.  Orders Placed This Encounter  Procedures  . Urine Culture  . POCT urinalysis dipstick    Plan:  Continued routine obstetrical care, sonogram is normal, see report  Return in about 1 month (around 05/22/2018) for [place].

## 2018-04-24 NOTE — Progress Notes (Signed)
[place] 20wks,breech,cx length 3.2 cm,normal ovaries bilat,anterior placenta gr 0,fhr 133 bpm,svp of fluid 5.8 cm,EFW 283 g 14%,anatomy complete,no obvious abnormalities

## 2018-04-26 LAB — URINE CULTURE: ORGANISM ID, BACTERIA: NO GROWTH

## 2018-05-22 ENCOUNTER — Encounter: Payer: Self-pay | Admitting: Advanced Practice Midwife

## 2018-05-22 ENCOUNTER — Ambulatory Visit (INDEPENDENT_AMBULATORY_CARE_PROVIDER_SITE_OTHER): Payer: BLUE CROSS/BLUE SHIELD | Admitting: Advanced Practice Midwife

## 2018-05-22 VITALS — BP 118/72 | HR 98 | Wt 123.0 lb

## 2018-05-22 DIAGNOSIS — O9989 Other specified diseases and conditions complicating pregnancy, childbirth and the puerperium: Secondary | ICD-10-CM

## 2018-05-22 DIAGNOSIS — Z1389 Encounter for screening for other disorder: Secondary | ICD-10-CM

## 2018-05-22 DIAGNOSIS — R8271 Bacteriuria: Secondary | ICD-10-CM

## 2018-05-22 DIAGNOSIS — Z331 Pregnant state, incidental: Secondary | ICD-10-CM

## 2018-05-22 DIAGNOSIS — O99891 Other specified diseases and conditions complicating pregnancy: Secondary | ICD-10-CM

## 2018-05-22 DIAGNOSIS — Z3402 Encounter for supervision of normal first pregnancy, second trimester: Secondary | ICD-10-CM

## 2018-05-22 DIAGNOSIS — Z3A24 24 weeks gestation of pregnancy: Secondary | ICD-10-CM

## 2018-05-22 LAB — POCT URINALYSIS DIPSTICK OB
Glucose, UA: NEGATIVE — AB
Ketones, UA: NEGATIVE
LEUKOCYTES UA: NEGATIVE
Nitrite, UA: NEGATIVE
POC,PROTEIN,UA: NEGATIVE
RBC UA: NEGATIVE

## 2018-05-22 NOTE — Progress Notes (Signed)
  G1P0000 [redacted]w[redacted]d Estimated Date of Delivery: 09/11/18  Blood pressure 118/72, pulse 98, weight 123 lb (55.8 kg), last menstrual period 12/05/2017.   BP weight and urine results all reviewed and noted.  Please refer to the obstetrical flow sheet for the fundal height and fetal heart rate documentation:  Patient reports good fetal movement, denies any bleeding and no rupture of membranes symptoms or regular contractions. Patient is without complaints. All questions were answered.   Physical Assessment:   Vitals:   05/22/18 0925  BP: 118/72  Pulse: 98  Weight: 123 lb (55.8 kg)  Body mass index is 21.79 kg/m.        Physical Examination:   General appearance: Well appearing, and in no distress  Mental status: Alert, oriented to person, place, and time  Skin: Warm & dry  Cardiovascular: Normal heart rate noted  Respiratory: Normal respiratory effort, no distress  Abdomen: Soft, gravid, nontender  Pelvic: Cervical exam deferred         Extremities: Edema: None  Fetal Status:     Movement: Present    Results for orders placed or performed in visit on 05/22/18 (from the past 24 hour(s))  POC Urinalysis Dipstick OB   Collection Time: 05/22/18  9:27 AM  Result Value Ref Range   Color, UA     Clarity, UA     Glucose, UA Negative (A) (none)   Bilirubin, UA     Ketones, UA neg    Spec Grav, UA  1.010 - 1.025   Blood, UA neg    pH, UA  5.0 - 8.0   POC Protein UA Negative Negative, Trace   Urobilinogen, UA  0.2 or 1.0 E.U./dL   Nitrite, UA neg    Leukocytes, UA Negative Negative   Appearance     Odor       Orders Placed This Encounter  Procedures  . POC Urinalysis Dipstick OB    Plan:  Continued routine obstetrical care,   Return in about 3 weeks (around 06/12/2018) for PN2/LROB.

## 2018-05-22 NOTE — Patient Instructions (Signed)

## 2018-06-12 ENCOUNTER — Other Ambulatory Visit: Payer: BLUE CROSS/BLUE SHIELD

## 2018-06-12 ENCOUNTER — Encounter: Payer: BLUE CROSS/BLUE SHIELD | Admitting: Advanced Practice Midwife

## 2018-06-18 ENCOUNTER — Ambulatory Visit (INDEPENDENT_AMBULATORY_CARE_PROVIDER_SITE_OTHER): Payer: BLUE CROSS/BLUE SHIELD | Admitting: Advanced Practice Midwife

## 2018-06-18 ENCOUNTER — Other Ambulatory Visit: Payer: BLUE CROSS/BLUE SHIELD

## 2018-06-18 VITALS — BP 114/64 | HR 89 | Wt 129.0 lb

## 2018-06-18 DIAGNOSIS — Z1389 Encounter for screening for other disorder: Secondary | ICD-10-CM

## 2018-06-18 DIAGNOSIS — Z23 Encounter for immunization: Secondary | ICD-10-CM | POA: Diagnosis not present

## 2018-06-18 DIAGNOSIS — Z131 Encounter for screening for diabetes mellitus: Secondary | ICD-10-CM

## 2018-06-18 DIAGNOSIS — Z331 Pregnant state, incidental: Secondary | ICD-10-CM

## 2018-06-18 DIAGNOSIS — Z3402 Encounter for supervision of normal first pregnancy, second trimester: Secondary | ICD-10-CM

## 2018-06-18 DIAGNOSIS — Z3A27 27 weeks gestation of pregnancy: Secondary | ICD-10-CM

## 2018-06-18 LAB — POCT URINALYSIS DIPSTICK OB
GLUCOSE, UA: NEGATIVE — AB
KETONES UA: NEGATIVE
Leukocytes, UA: NEGATIVE
NITRITE UA: NEGATIVE
POC,PROTEIN,UA: NEGATIVE
RBC UA: NEGATIVE

## 2018-06-18 NOTE — Progress Notes (Signed)
  G1P0000 [redacted]w[redacted]d Estimated Date of Delivery: 09/11/18  Blood pressure 114/64, pulse 89, weight 129 lb (58.5 kg), last menstrual period 12/05/2017.   BP weight and urine results all reviewed and noted.  Please refer to the obstetrical flow sheet for the fundal height and fetal heart rate documentation:  Patient reports good fetal movement, denies any bleeding and no rupture of membranes symptoms or regular contractions. Patient is without complaints. All questions were answered.   Physical Assessment:   Vitals:   06/18/18 0935  BP: 114/64  Pulse: 89  Weight: 129 lb (58.5 kg)  Body mass index is 22.85 kg/m.        Physical Examination:   General appearance: Well appearing, and in no distress  Mental status: Alert, oriented to person, place, and time  Skin: Warm & dry  Cardiovascular: Normal heart rate noted  Respiratory: Normal respiratory effort, no distress  Abdomen: Soft, gravid, nontender  Pelvic: Cervical exam deferred         Extremities: Edema: None  Fetal Status: Fetal Heart Rate (bpm): 150 Fundal Height: 27 cm Movement: Present    Results for orders placed or performed in visit on 06/18/18 (from the past 24 hour(s))  POC Urinalysis Dipstick OB   Collection Time: 06/18/18  9:42 AM  Result Value Ref Range   Color, UA     Clarity, UA     Glucose, UA Negative (A) (none)   Bilirubin, UA     Ketones, UA neg    Spec Grav, UA     Blood, UA neg    pH, UA     POC Protein UA Negative Negative, Trace   Urobilinogen, UA     Nitrite, UA neg    Leukocytes, UA Negative Negative   Appearance     Odor       Orders Placed This Encounter  Procedures  . Tdap vaccine greater than or equal to 7yo IM  . POC Urinalysis Dipstick OB    Plan:  Continued routine obstetrical care,   Return in about 3 weeks (around 07/09/2018) for LROB.

## 2018-06-18 NOTE — Patient Instructions (Addendum)
Henderson Cloud, I greatly value your feedback.  If you receive a survey following your visit with [place] today, we appreciate you taking the time to fill it out.  Thanks, Nigel Berthold, CNM   Call the office [phone]) or go to [facility] if:  You begin to have strong, frequent contractions  Your water breaks.  Sometimes it is a big gush of fluid, sometimes it is just a trickle that keeps getting your panties wet or running down your legs  You have vaginal bleeding.  It is normal to have a small amount of spotting if your cervix was checked.   You don't feel your baby moving like normal.  If you don't, get you something to eat and drink and lay down and focus on feeling your baby move.  You should feel at least 10 movements in 2 hours.  If you don't, you should call the office or go to [facility].    Tdap Vaccine  It is recommended that you get the Tdap vaccine during the third trimester of EACH pregnancy to help protect your baby from getting pertussis (whooping cough)  27-36 weeks is the BEST time to do this so that you can pass the protection on to your baby. During pregnancy is better than after pregnancy, but if you are unable to get it during pregnancy it will be offered at the hospital.   You will be offered this vaccine in the office after 27 weeks. If you do not have health insurance, you can get this vaccine at the health department or your family doctor  Everyone who will be around your baby should also be up-to-date on their vaccines. Adults (who are not pregnant) only need 1 dose of Tdap during adulthood.   Third Trimester of Pregnancy The third trimester is from week 29 through week 42, months 7 through 9. The third trimester is a time when the fetus is growing rapidly. At the end of the ninth month, the fetus is about 20 inches in length and weighs 6-10 pounds.  BODY CHANGES Your body goes through many changes during pregnancy. The changes vary from  woman to woman.   Your weight will continue to increase. You can expect to gain 25-35 pounds (11-16 kg) by the end of the pregnancy.  You may begin to get stretch marks on your hips, abdomen, and breasts.  You may urinate more often because the fetus is moving lower into your pelvis and pressing on your bladder.  You may develop or continue to have heartburn as a result of your pregnancy.  You may develop constipation because certain hormones are causing the muscles that push waste through your intestines to slow down.  You may develop hemorrhoids or swollen, bulging veins (varicose veins).  You may have pelvic pain because of the weight gain and pregnancy hormones relaxing your joints between the bones in your pelvis. Backaches may result from overexertion of the muscles supporting your posture.  You may have changes in your hair. These can include thickening of your hair, rapid growth, and changes in texture. Some women also have hair loss during or after pregnancy, or hair that feels dry or thin. Your hair will most likely return to normal after your baby is born.  Your breasts will continue to grow and be tender. A yellow discharge may leak from your breasts called colostrum.  Your belly button may stick out.  You may feel short of breath because of your expanding uterus.  You may notice the fetus "dropping," or moving lower in your abdomen.  You may have a bloody mucus discharge. This usually occurs a few days to a week before labor begins.  Your cervix becomes thin and soft (effaced) near your due date. WHAT TO EXPECT AT YOUR PRENATAL EXAMS  You will have prenatal exams every 2 weeks until week 36. Then, you will have weekly prenatal exams. During a routine prenatal visit:  You will be weighed to make sure you and the fetus are growing normally.  Your blood pressure is taken.  Your abdomen will be measured to track your baby's growth.  The fetal heartbeat will be listened  to.  Any test results from the previous visit will be discussed.  You may have a cervical check near your due date to see if you have effaced. At around 36 weeks, your caregiver will check your cervix. At the same time, your caregiver will also perform a test on the secretions of the vaginal tissue. This test is to determine if a type of bacteria, Group B streptococcus, is present. Your caregiver will explain this further. Your caregiver may ask you:  What your birth plan is.  How you are feeling.  If you are feeling the baby move.  If you have had any abnormal symptoms, such as leaking fluid, bleeding, severe headaches, or abdominal cramping.  If you have any questions. Other tests or screenings that may be performed during your third trimester include:  Blood tests that check for low iron levels (anemia).  Fetal testing to check the health, activity level, and growth of the fetus. Testing is done if you have certain medical conditions or if there are problems during the pregnancy. FALSE LABOR You may feel small, irregular contractions that eventually go away. These are called Braxton Hicks contractions, or false labor. Contractions may last for hours, days, or even weeks before true labor sets in. If contractions come at regular intervals, intensify, or become painful, it is best to be seen by your caregiver.  SIGNS OF LABOR   Menstrual-like cramps.  Contractions that are 5 minutes apart or less.  Contractions that start on the top of the uterus and spread down to the lower abdomen and back.  A sense of increased pelvic pressure or back pain.  A watery or bloody mucus discharge that comes from the vagina. If you have any of these signs before the 37th week of pregnancy, call your caregiver right away. You need to go to the hospital to get checked immediately. HOME CARE INSTRUCTIONS   Avoid all smoking, herbs, alcohol, and unprescribed drugs. These chemicals affect the  formation and growth of the baby.  Follow your caregiver's instructions regarding medicine use. There are medicines that are either safe or unsafe to take during pregnancy.  Exercise only as directed by your caregiver. Experiencing uterine cramps is a good sign to stop exercising.  Continue to eat regular, healthy meals.  Wear a good support bra for breast tenderness.  Do not use hot tubs, steam rooms, or saunas.  Wear your seat belt at all times when driving.  Avoid raw meat, uncooked cheese, cat litter boxes, and soil used by cats. These carry germs that can cause birth defects in the baby.  Take your prenatal vitamins.  Try taking a stool softener (if your caregiver approves) if you develop constipation. Eat more high-fiber foods, such as fresh vegetables or fruit and whole grains. Drink plenty of fluids to keep your urine  clear or pale yellow.  Take warm sitz baths to soothe any pain or discomfort caused by hemorrhoids. Use hemorrhoid cream if your caregiver approves.  If you develop varicose veins, wear support hose. Elevate your feet for 15 minutes, 3-4 times a day. Limit salt in your diet.  Avoid heavy lifting, wear low heal shoes, and practice good posture.  Rest a lot with your legs elevated if you have leg cramps or low back pain.  Visit your dentist if you have not gone during your pregnancy. Use a soft toothbrush to brush your teeth and be gentle when you floss.  A sexual relationship may be continued unless your caregiver directs you otherwise.  Do not travel far distances unless it is absolutely necessary and only with the approval of your caregiver.  Take prenatal classes to understand, practice, and ask questions about the labor and delivery.  Make a trial run to the hospital.  Pack your hospital bag.  Prepare the baby's nursery.  Continue to go to all your prenatal visits as directed by your caregiver. SEEK MEDICAL CARE IF:  You are unsure if you are in  labor or if your water has broken.  You have dizziness.  You have mild pelvic cramps, pelvic pressure, or nagging pain in your abdominal area.  You have persistent nausea, vomiting, or diarrhea.  You have a bad smelling vaginal discharge.  You have pain with urination. SEEK IMMEDIATE MEDICAL CARE IF:   You have a fever.  You are leaking fluid from your vagina.  You have spotting or bleeding from your vagina.  You have severe abdominal cramping or pain.  You have rapid weight loss or gain.  You have shortness of breath with chest pain.  You notice sudden or extreme swelling of your face, hands, ankles, feet, or legs.  You have not felt your baby move in over an hour.  You have severe headaches that do not go away with medicine.  You have vision changes. Document Released: 10/16/2001 Document Revised: 10/27/2013 Document Reviewed: 12/23/2012 [facility] Patient Information 2015 [place], [place]. This information is not intended to replace advice given to you by your health care provider. Make sure you discuss any- questions you have with your health care provider.   [place] Pediatricians/Family Doctors:  [facility] [place] [phone]             [place]   [place] [phone] (usually not accepting new patients unless you have family there already, you are always welcome to call and ask)       [facility] Department [phone]       [facility] Pediatricians/Family Doctors:   [facility]: [phone]  [facility]: [phone]  [facility]: [place] Doctors:   [facility]: [place]: [place]:  [place]: [phone]

## 2018-06-19 LAB — CBC
HEMATOCRIT: 33.7 % — AB (ref 34.0–46.6)
Hemoglobin: 11.8 g/dL (ref 11.1–15.9)
MCH: 32.4 pg (ref 26.6–33.0)
MCHC: 35 g/dL (ref 31.5–35.7)
MCV: 93 fL (ref 79–97)
PLATELETS: 162 10*3/uL (ref 150–450)
RBC: 3.64 x10E6/uL — AB (ref 3.77–5.28)
RDW: 13.2 % (ref 12.3–15.4)
WBC: 7.6 10*3/uL (ref 3.4–10.8)

## 2018-06-19 LAB — GLUCOSE TOLERANCE, 2 HOURS W/ 1HR
GLUCOSE, FASTING: 76 mg/dL (ref 65–91)
Glucose, 1 hour: 164 mg/dL (ref 65–179)
Glucose, 2 hour: 149 mg/dL (ref 65–152)

## 2018-06-19 LAB — ANTIBODY SCREEN: Antibody Screen: NEGATIVE

## 2018-06-19 LAB — RPR: RPR: NONREACTIVE

## 2018-06-19 LAB — HIV ANTIBODY (ROUTINE TESTING W REFLEX): HIV SCREEN 4TH GENERATION: NONREACTIVE

## 2018-07-09 ENCOUNTER — Encounter: Payer: Self-pay | Admitting: Women's Health

## 2018-07-09 ENCOUNTER — Ambulatory Visit (INDEPENDENT_AMBULATORY_CARE_PROVIDER_SITE_OTHER): Payer: BLUE CROSS/BLUE SHIELD | Admitting: Women's Health

## 2018-07-09 VITALS — BP 106/74 | HR 98 | Wt 135.0 lb

## 2018-07-09 DIAGNOSIS — Z331 Pregnant state, incidental: Secondary | ICD-10-CM

## 2018-07-09 DIAGNOSIS — Z23 Encounter for immunization: Secondary | ICD-10-CM | POA: Diagnosis not present

## 2018-07-09 DIAGNOSIS — O26843 Uterine size-date discrepancy, third trimester: Secondary | ICD-10-CM

## 2018-07-09 DIAGNOSIS — Z1389 Encounter for screening for other disorder: Secondary | ICD-10-CM

## 2018-07-09 DIAGNOSIS — Z3403 Encounter for supervision of normal first pregnancy, third trimester: Secondary | ICD-10-CM

## 2018-07-09 DIAGNOSIS — Z3A30 30 weeks gestation of pregnancy: Secondary | ICD-10-CM

## 2018-07-09 LAB — POCT URINALYSIS DIPSTICK OB
GLUCOSE, UA: NEGATIVE
Ketones, UA: NEGATIVE
Leukocytes, UA: NEGATIVE
Nitrite, UA: NEGATIVE
POC,PROTEIN,UA: NEGATIVE
RBC UA: NEGATIVE

## 2018-07-09 NOTE — Progress Notes (Signed)
   LOW-RISK PREGNANCY VISIT Patient name: Lindsay Blackwell MRN [id number]  Date of birth: [birth year] Chief Complaint:   Routine Prenatal Visit (lt calf area hurt during the day)  History of Present Illness:   Lindsay Blackwell is a 17 y.o. G1P0000 female at [redacted]w[redacted]d with an Estimated Date of Delivery: 09/11/18 being seen today for ongoing management of a low-risk pregnancy.  Today she reports got cramp in Lt calf this am while stretching it, had to massage it out, still a little ore. Contractions: Irregular.  .  Movement: Present. denies leaking of fluid. Review of Systems:   Pertinent items are noted in HPI Denies abnormal vaginal discharge w/ itching/odor/irritation, headaches, visual changes, shortness of breath, chest pain, abdominal pain, severe nausea/vomiting, or problems with urination or bowel movements unless otherwise stated above. Pertinent History Reviewed:  Reviewed past medical,surgical, social, obstetrical and family history.  Reviewed problem list, medications and allergies. Physical Assessment:   Vitals:   07/09/18 0952  BP: 106/74  Pulse: 98  Weight: 135 lb (61.2 kg)  Body mass index is 23.91 kg/m.        Physical Examination:   General appearance: Well appearing, and in no distress  Mental status: Alert, oriented to person, place, and time  Skin: Warm & dry  Cardiovascular: Normal heart rate noted  Respiratory: Normal respiratory effort, no distress  Abdomen: Soft, gravid, nontender  Pelvic: Cervical exam deferred         Extremities: Edema: None, no erythema, edema, cords, neg Homan's  Fetal Status: Fetal Heart Rate (bpm): 132 Fundal Height: 26 cm Movement: Present    Results for orders placed or performed in visit on 07/09/18 (from the past 24 hour(s))  POC Urinalysis Dipstick OB   Collection Time: 07/09/18  9:58 AM  Result Value Ref Range   Color, UA     Clarity, UA     Glucose, UA Negative Negative   Bilirubin, UA     Ketones, UA neg    Spec  Grav, UA     Blood, UA neg    pH, UA     POC Protein UA Negative Negative, Trace   Urobilinogen, UA     Nitrite, UA neg    Leukocytes, UA Negative Negative   Appearance     Odor      Assessment & Plan:  1) Low-risk pregnancy G1P0000 at [redacted]w[redacted]d with an Estimated Date of Delivery: 09/11/18   2) Uterine size<dates, will get EFW/AFI u/s  3) Lt calf cramp/soreness> no s/s DVT, pt to monitor, if changes/doesn't improve let us know   Meds: No orders of the defined types were placed in this encounter.  Labs/procedures today: flu shot  Plan:  Continue routine obstetrical care   Reviewed: Preterm labor symptoms and general obstetric precautions including but not limited to vaginal bleeding, contractions, leaking of fluid and fetal movement were reviewed in detail with the patient.  All questions were answered  Follow-up: Return for asap for efw u/s (no visit), then 2wks for LROB.  Orders Placed This Encounter  Procedures  . US OB Follow Up  . POC Urinalysis Dipstick OB   Roma Schanz CNM, [facility] 07/09/2018 10:39 AM

## 2018-07-09 NOTE — Patient Instructions (Signed)
Henderson Cloud, I greatly value your feedback.  If you receive a survey following your visit with [place] today, we appreciate you taking the time to fill it out.  Thanks, Knute Neu, CNM, WHNP-BC   Call the office [phone]) or go to [facility] if:  You begin to have strong, frequent contractions  Your water breaks.  Sometimes it is a big gush of fluid, sometimes it is just a trickle that keeps getting your panties wet or running down your legs  You have vaginal bleeding.  It is normal to have a small amount of spotting if your cervix was checked.   You don't feel your baby moving like normal.  If you don't, get you something to eat and drink and lay down and focus on feeling your baby move.  You should feel at least 10 movements in 2 hours.  If you don't, you should call the office or go to [facility].    Tdap Vaccine  It is recommended that you get the Tdap vaccine during the third trimester of EACH pregnancy to help protect your baby from getting pertussis (whooping cough)  27-36 weeks is the BEST time to do this so that you can pass the protection on to your baby. During pregnancy is better than after pregnancy, but if you are unable to get it during pregnancy it will be offered at the hospital.   You can get this vaccine with [place], at the health department, your family doctor, or some local pharmacies  Everyone who will be around your baby should also be up-to-date on their vaccines before the baby comes. Adults (who are not pregnant) only need 1 dose of Tdap during adulthood.   Third Trimester of Pregnancy The third trimester is from week 29 through week 42, months 7 through 9. The third trimester is a time when the fetus is growing rapidly. At the end of the ninth month, the fetus is about 20 inches in length and weighs 6-10 pounds.  BODY CHANGES Your body goes through many changes during pregnancy. The changes vary from woman to woman.   Your weight will continue to  increase. You can expect to gain 25-35 pounds (11-16 kg) by the end of the pregnancy.  You may begin to get stretch marks on your hips, abdomen, and breasts.  You may urinate more often because the fetus is moving lower into your pelvis and pressing on your bladder.  You may develop or continue to have heartburn as a result of your pregnancy.  You may develop constipation because certain hormones are causing the muscles that push waste through your intestines to slow down.  You may develop hemorrhoids or swollen, bulging veins (varicose veins).  You may have pelvic pain because of the weight gain and pregnancy hormones relaxing your joints between the bones in your pelvis. Backaches may result from overexertion of the muscles supporting your posture.  You may have changes in your hair. These can include thickening of your hair, rapid growth, and changes in texture. Some women also have hair loss during or after pregnancy, or hair that feels dry or thin. Your hair will most likely return to normal after your baby is born.  Your breasts will continue to grow and be tender. A yellow discharge may leak from your breasts called colostrum.  Your belly button may stick out.  You may feel short of breath because of your expanding uterus.  You may notice the fetus "dropping," or moving lower  in your abdomen.  You may have a bloody mucus discharge. This usually occurs a few days to a week before labor begins.  Your cervix becomes thin and soft (effaced) near your due date. WHAT TO EXPECT AT YOUR PRENATAL EXAMS  You will have prenatal exams every 2 weeks until week 36. Then, you will have weekly prenatal exams. During a routine prenatal visit:  You will be weighed to make sure you and the fetus are growing normally.  Your blood pressure is taken.  Your abdomen will be measured to track your baby's growth.  The fetal heartbeat will be listened to.  Any test results from the previous visit  will be discussed.  You may have a cervical check near your due date to see if you have effaced. At around 36 weeks, your caregiver will check your cervix. At the same time, your caregiver will also perform a test on the secretions of the vaginal tissue. This test is to determine if a type of bacteria, Group B streptococcus, is present. Your caregiver will explain this further. Your caregiver may ask you:  What your birth plan is.  How you are feeling.  If you are feeling the baby move.  If you have had any abnormal symptoms, such as leaking fluid, bleeding, severe headaches, or abdominal cramping.  If you have any questions. Other tests or screenings that may be performed during your third trimester include:  Blood tests that check for low iron levels (anemia).  Fetal testing to check the health, activity level, and growth of the fetus. Testing is done if you have certain medical conditions or if there are problems during the pregnancy. FALSE LABOR You may feel small, irregular contractions that eventually go away. These are called Braxton Hicks contractions, or false labor. Contractions may last for hours, days, or even weeks before true labor sets in. If contractions come at regular intervals, intensify, or become painful, it is best to be seen by your caregiver.  SIGNS OF LABOR   Menstrual-like cramps.  Contractions that are 5 minutes apart or less.  Contractions that start on the top of the uterus and spread down to the lower abdomen and back.  A sense of increased pelvic pressure or back pain.  A watery or bloody mucus discharge that comes from the vagina. If you have any of these signs before the 37th week of pregnancy, call your caregiver right away. You need to go to the hospital to get checked immediately. HOME CARE INSTRUCTIONS   Avoid all smoking, herbs, alcohol, and unprescribed drugs. These chemicals affect the formation and growth of the baby.  Follow your  caregiver's instructions regarding medicine use. There are medicines that are either safe or unsafe to take during pregnancy.  Exercise only as directed by your caregiver. Experiencing uterine cramps is a good sign to stop exercising.  Continue to eat regular, healthy meals.  Wear a good support bra for breast tenderness.  Do not use hot tubs, steam rooms, or saunas.  Wear your seat belt at all times when driving.  Avoid raw meat, uncooked cheese, cat litter boxes, and soil used by cats. These carry germs that can cause birth defects in the baby.  Take your prenatal vitamins.  Try taking a stool softener (if your caregiver approves) if you develop constipation. Eat more high-fiber foods, such as fresh vegetables or fruit and whole grains. Drink plenty of fluids to keep your urine clear or pale yellow.  Take warm sitz baths   to soothe any pain or discomfort caused by hemorrhoids. Use hemorrhoid cream if your caregiver approves.  If you develop varicose veins, wear support hose. Elevate your feet for 15 minutes, 3-4 times a day. Limit salt in your diet.  Avoid heavy lifting, wear low heal shoes, and practice good posture.  Rest a lot with your legs elevated if you have leg cramps or low back pain.  Visit your dentist if you have not gone during your pregnancy. Use a soft toothbrush to brush your teeth and be gentle when you floss.  A sexual relationship may be continued unless your caregiver directs you otherwise.  Do not travel far distances unless it is absolutely necessary and only with the approval of your caregiver.  Take prenatal classes to understand, practice, and ask questions about the labor and delivery.  Make a trial run to the hospital.  Pack your hospital bag.  Prepare the baby's nursery.  Continue to go to all your prenatal visits as directed by your caregiver. SEEK MEDICAL CARE IF:  You are unsure if you are in labor or if your water has broken.  You have  dizziness.  You have mild pelvic cramps, pelvic pressure, or nagging pain in your abdominal area.  You have persistent nausea, vomiting, or diarrhea.  You have a bad smelling vaginal discharge.  You have pain with urination. SEEK IMMEDIATE MEDICAL CARE IF:   You have a fever.  You are leaking fluid from your vagina.  You have spotting or bleeding from your vagina.  You have severe abdominal cramping or pain.  You have rapid weight loss or gain.  You have shortness of breath with chest pain.  You notice sudden or extreme swelling of your face, hands, ankles, feet, or legs.  You have not felt your baby move in over an hour.  You have severe headaches that do not go away with medicine.  You have vision changes. Document Released: 10/16/2001 Document Revised: 10/27/2013 Document Reviewed: 12/23/2012 [facility] Patient Information 2015 [place], [place]. This information is not intended to replace advice given to you by your health care provider. Make sure you discuss any questions you have with your health care provider.

## 2018-07-15 ENCOUNTER — Ambulatory Visit (INDEPENDENT_AMBULATORY_CARE_PROVIDER_SITE_OTHER): Payer: BLUE CROSS/BLUE SHIELD

## 2018-07-15 DIAGNOSIS — O26843 Uterine size-date discrepancy, third trimester: Secondary | ICD-10-CM | POA: Diagnosis not present

## 2018-07-15 DIAGNOSIS — Z3403 Encounter for supervision of normal first pregnancy, third trimester: Secondary | ICD-10-CM

## 2018-07-15 NOTE — Progress Notes (Signed)
[place] 50+3 wks,cephalic,cx 2.8 cm,anterior pl gr 2,normal ovaries bilat,afi 17 cm,fhr 127 bpm,efw 1746 g 28 %

## 2018-07-23 ENCOUNTER — Encounter: Payer: Self-pay | Admitting: Women's Health

## 2018-07-23 ENCOUNTER — Ambulatory Visit (INDEPENDENT_AMBULATORY_CARE_PROVIDER_SITE_OTHER): Payer: BLUE CROSS/BLUE SHIELD | Admitting: Women's Health

## 2018-07-23 VITALS — BP 107/65 | HR 91 | Wt 141.0 lb

## 2018-07-23 DIAGNOSIS — Z3A32 32 weeks gestation of pregnancy: Secondary | ICD-10-CM

## 2018-07-23 DIAGNOSIS — Z3403 Encounter for supervision of normal first pregnancy, third trimester: Secondary | ICD-10-CM

## 2018-07-23 DIAGNOSIS — Z331 Pregnant state, incidental: Secondary | ICD-10-CM

## 2018-07-23 DIAGNOSIS — Z1389 Encounter for screening for other disorder: Secondary | ICD-10-CM

## 2018-07-23 LAB — POCT URINALYSIS DIPSTICK OB
Blood, UA: NEGATIVE
Glucose, UA: NEGATIVE
Ketones, UA: NEGATIVE
LEUKOCYTES UA: NEGATIVE
Nitrite, UA: NEGATIVE
POC,PROTEIN,UA: NEGATIVE

## 2018-07-23 NOTE — Progress Notes (Signed)
   LOW-RISK PREGNANCY VISIT Patient name: Lindsay Blackwell MRN [id number]  Date of birth: [birth year] Chief Complaint:   Routine Prenatal Visit (leg cramps yesterday)  History of Present Illness:   Lindsay Blackwell is a 17 y.o. G77P0000 female at [redacted]w[redacted]d with an Estimated Date of Delivery: 09/11/18 being seen today for ongoing management of a low-risk pregnancy.  Today she reports leg cramps, constipation. Contractions: Not present.  .  Movement: Present. denies leaking of fluid. Review of Systems:   Pertinent items are noted in HPI Denies abnormal vaginal discharge w/ itching/odor/irritation, headaches, visual changes, shortness of breath, chest pain, abdominal pain, severe nausea/vomiting, or problems with urination or bowel movements unless otherwise stated above. Pertinent History Reviewed:  Reviewed past medical,surgical, social, obstetrical and family history.  Reviewed problem list, medications and allergies. Physical Assessment:   Vitals:   07/23/18 1015  BP: 107/65  Pulse: 91  Weight: 141 lb (64 kg)  Body mass index is 24.98 kg/m.        Physical Examination:   General appearance: Well appearing, and in no distress  Mental status: Alert, oriented to person, place, and time  Skin: Warm & dry  Cardiovascular: Normal heart rate noted  Respiratory: Normal respiratory effort, no distress  Abdomen: Soft, gravid, nontender  Pelvic: Cervical exam deferred         Extremities: Edema: None  Fetal Status: Fetal Heart Rate (bpm): 132 Fundal Height: 32 cm Movement: Present    Results for orders placed or performed in visit on 07/23/18 (from the past 24 hour(s))  POC Urinalysis Dipstick OB   Collection Time: 07/23/18 10:24 AM  Result Value Ref Range   Color, UA     Clarity, UA     Glucose, UA Negative Negative   Bilirubin, UA     Ketones, UA neg    Spec Grav, UA     Blood, UA neg    pH, UA     POC Protein UA Negative Negative, Trace   Urobilinogen, UA     Nitrite, UA  neg    Leukocytes, UA Negative Negative   Appearance     Odor      Assessment & Plan:  1) Low-risk pregnancy G1P0000 at [redacted]w[redacted]d with an Estimated Date of Delivery: 09/11/18   2) Leg cramps, gave printed prevention/relief measures   3) Constipation> gave printed prevention/relief measures    Meds: No orders of the defined types were placed in this encounter.  Labs/procedures today: none  Plan:  Continue routine obstetrical care   Reviewed: Preterm labor symptoms and general obstetric precautions including but not limited to vaginal bleeding, contractions, leaking of fluid and fetal movement were reviewed in detail with the patient.  All questions were answered  Follow-up: Return in about 2 weeks (around 08/06/2018) for [place].  Orders Placed This Encounter  Procedures  . POC Urinalysis Dipstick OB   Roma Schanz CNM, [facility] 07/23/2018 10:35 AM

## 2018-07-23 NOTE — Patient Instructions (Addendum)
Lindsay Blackwell, I greatly value your feedback.  If you receive a survey following your visit with [place] today, we appreciate you taking the time to fill it out.  Thanks, Lindsay Blackwell, CNM, WHNP-BC   Call the office [phone]) or go to [facility] if:  You begin to have strong, frequent contractions  Your water breaks.  Sometimes it is a big gush of fluid, sometimes it is just a trickle that keeps getting your panties wet or running down your legs  You have vaginal bleeding.  It is normal to have a small amount of spotting if your cervix was checked.   You don't feel your baby moving like normal.  If you don't, get you something to eat and drink and lay down and focus on feeling your baby move.  You should feel at least 10 movements in 2 hours.  If you don't, you should call the office or go to [facility].   Constipation  Drink plenty of fluid, preferably water, throughout the day  Eat foods high in fiber such as fruits, vegetables, and grains  Exercise, such as walking, is a good way to keep your bowels regular  Drink warm fluids, especially warm prune juice, or decaf coffee  Eat a 1/2 cup of real oatmeal (not instant), 1/2 cup applesauce, and 1/2-1 cup warm prune juice every day  If needed, you may take Colace (docusate sodium) stool softener once or twice a day to help keep the stool soft. If you are pregnant, wait until you are out of your first trimester (12-14 weeks of pregnancy)  If you still are having problems with constipation, you may take Miralax once daily as needed to help keep your bowels regular.  If you are pregnant, wait until you are out of your first trimester (12-14 weeks of pregnancy)      Preterm Labor and Birth Information The normal length of a pregnancy is 39-41 weeks. Preterm labor is when labor starts before 37 completed weeks of pregnancy. What are the risk factors for preterm labor? Preterm labor is more likely to occur in women  who:  Have certain infections during pregnancy such as a bladder infection, sexually transmitted infection, or infection inside the uterus (chorioamnionitis).  Have a shorter-than-normal cervix.  Have gone into preterm labor before.  Have had surgery on their cervix.  Are younger than age 93 or older than age 49.  Are African American.  Are pregnant with twins or multiple babies (multiple gestation).  Take street drugs or smoke while pregnant.  Do not gain enough weight while pregnant.  Became pregnant shortly after having been pregnant.  What are the symptoms of preterm labor? Symptoms of preterm labor include:  Cramps similar to those that can happen during a menstrual period. The cramps may happen with diarrhea.  Pain in the abdomen or lower back.  Regular uterine contractions that may feel like tightening of the abdomen.  A feeling of increased pressure in the pelvis.  Increased watery or bloody mucus discharge from the vagina.  Water breaking (ruptured amniotic sac).  Why is it important to recognize signs of preterm labor? It is important to recognize signs of preterm labor because babies who are born prematurely may not be fully developed. This can put them at an increased risk for:  Long-term (chronic) heart and lung problems.  Difficulty immediately after birth with regulating body systems, including blood sugar, body temperature, heart rate, and breathing rate.  Bleeding in the brain.  Cerebral palsy.  Learning difficulties.  Death.  These risks are highest for babies who are born before 22 weeks of pregnancy. How is preterm labor treated? Treatment depends on the length of your pregnancy, your condition, and the health of your baby. It may involve:  Having a stitch (suture) placed in your cervix to prevent your cervix from opening too early (cerclage).  Taking or being given medicines, such as: ? Hormone medicines. These may be given early in  pregnancy to help support the pregnancy. ? Medicine to stop contractions. ? Medicines to help mature the baby's lungs. These may be prescribed if the risk of delivery is high. ? Medicines to prevent your baby from developing cerebral palsy.  If the labor happens before 34 weeks of pregnancy, you may need to stay in the hospital. What should I do if I think I am in preterm labor? If you think that you are going into preterm labor, call your health care provider right away. How can I prevent preterm labor in future pregnancies? To increase your chance of having a full-term pregnancy:  Do not use any tobacco products, such as cigarettes, chewing tobacco, and e-cigarettes. If you need help quitting, ask your health care provider.  Do not use street drugs or medicines that have not been prescribed to you during your pregnancy.  Talk with your health care provider before taking any herbal supplements, even if you have been taking them regularly.  Make sure you gain a healthy amount of weight during your pregnancy.  Watch for infection. If you think that you might have an infection, get it checked right away.  Make sure to tell your health care provider if you have gone into preterm labor before.  This information is not intended to replace advice given to you by your health care provider. Make sure you discuss any questions you have with your health care provider. Document Released: 01/12/2004 Document Revised: 04/03/2016 Document Reviewed: 03/14/2016 [employer]vier Interactive Patient Education  2018 [employer].

## 2018-08-06 ENCOUNTER — Encounter: Payer: Self-pay | Admitting: Women's Health

## 2018-08-06 ENCOUNTER — Ambulatory Visit (INDEPENDENT_AMBULATORY_CARE_PROVIDER_SITE_OTHER): Payer: BLUE CROSS/BLUE SHIELD | Admitting: Women's Health

## 2018-08-06 VITALS — BP 115/71 | HR 85 | Wt 141.5 lb

## 2018-08-06 DIAGNOSIS — O4693 Antepartum hemorrhage, unspecified, third trimester: Secondary | ICD-10-CM

## 2018-08-06 DIAGNOSIS — Z3A34 34 weeks gestation of pregnancy: Secondary | ICD-10-CM

## 2018-08-06 DIAGNOSIS — Z331 Pregnant state, incidental: Secondary | ICD-10-CM

## 2018-08-06 DIAGNOSIS — Z1389 Encounter for screening for other disorder: Secondary | ICD-10-CM

## 2018-08-06 DIAGNOSIS — Z3403 Encounter for supervision of normal first pregnancy, third trimester: Secondary | ICD-10-CM

## 2018-08-06 LAB — POCT URINALYSIS DIPSTICK OB
Glucose, UA: NEGATIVE
KETONES UA: NEGATIVE
Leukocytes, UA: NEGATIVE
Nitrite, UA: NEGATIVE
POC,PROTEIN,UA: NEGATIVE

## 2018-08-06 LAB — POCT WET PREP (WET MOUNT)
CLUE CELLS WET PREP WHIFF POC: NEGATIVE
Trichomonas Wet Prep HPF POC: ABSENT

## 2018-08-06 NOTE — Patient Instructions (Addendum)
Lindsay Blackwell, I greatly value your feedback.  If you receive a survey following your visit with [place] today, we appreciate you taking the time to fill it out.  Thanks, Knute Neu, CNM, WHNP-BC  Nothing in the vagina for at least 7 days from last colored discharge   Call the office [phone]) or go to [facility] if:  You begin to have strong, frequent contractions  Your water breaks.  Sometimes it is a big gush of fluid, sometimes it is just a trickle that keeps getting your panties wet or running down your legs  You have vaginal bleeding.  It is normal to have a small amount of spotting if your cervix was checked.   You don't feel your baby moving like normal.  If you don't, get you something to eat and drink and lay down and focus on feeling your baby move.  You should feel at least 10 movements in 2 hours.  If you don't, you should call the office or go to [place] [facility] Birth Information The normal length of a pregnancy is 39-41 weeks. Preterm labor is when labor starts before 37 completed weeks of pregnancy. What are the risk factors for preterm labor? Preterm labor is more likely to occur in women who:  Have certain infections during pregnancy such as a bladder infection, sexually transmitted infection, or infection inside the uterus (chorioamnionitis).  Have a shorter-than-normal cervix.  Have gone into preterm labor before.  Have had surgery on their cervix.  Are younger than age 28 or older than age 35.  Are African American.  Are pregnant with twins or multiple babies (multiple gestation).  Take street drugs or smoke while pregnant.  Do not gain enough weight while pregnant.  Became pregnant shortly after having been pregnant.  What are the symptoms of preterm labor? Symptoms of preterm labor include:  Cramps similar to those that can happen during a menstrual period. The cramps may happen with diarrhea.  Pain in the abdomen  or lower back.  Regular uterine contractions that may feel like tightening of the abdomen.  A feeling of increased pressure in the pelvis.  Increased watery or bloody mucus discharge from the vagina.  Water breaking (ruptured amniotic sac).  Why is it important to recognize signs of preterm labor? It is important to recognize signs of preterm labor because babies who are born prematurely may not be fully developed. This can put them at an increased risk for:  Long-term (chronic) heart and lung problems.  Difficulty immediately after birth with regulating body systems, including blood sugar, body temperature, heart rate, and breathing rate.  Bleeding in the brain.  Cerebral palsy.  Learning difficulties.  Death.  These risks are highest for babies who are born before 77 weeks of pregnancy. How is preterm labor treated? Treatment depends on the length of your pregnancy, your condition, and the health of your baby. It may involve:  Having a stitch (suture) placed in your cervix to prevent your cervix from opening too early (cerclage).  Taking or being given medicines, such as: ? Hormone medicines. These may be given early in pregnancy to help support the pregnancy. ? Medicine to stop contractions. ? Medicines to help mature the baby's lungs. These may be prescribed if the risk of delivery is high. ? Medicines to prevent your baby from developing cerebral palsy.  If the labor happens before 34 weeks of pregnancy, you may need to stay in the  hospital. What should I do if I think I am in preterm labor? If you think that you are going into preterm labor, call your health care provider right away. How can I prevent preterm labor in future pregnancies? To increase your chance of having a full-term pregnancy:  Do not use any tobacco products, such as cigarettes, chewing tobacco, and e-cigarettes. If you need help quitting, ask your health care provider.  Do not use street drugs or  medicines that have not been prescribed to you during your pregnancy.  Talk with your health care provider before taking any herbal supplements, even if you have been taking them regularly.  Make sure you gain a healthy amount of weight during your pregnancy.  Watch for infection. If you think that you might have an infection, get it checked right away.  Make sure to tell your health care provider if you have gone into preterm labor before.  This information is not intended to replace advice given to you by your health care provider. Make sure you discuss any questions you have with your health care provider. Document Released: 01/12/2004 Document Revised: 04/03/2016 Document Reviewed: 03/14/2016 Elsevier Interactive Patient Education  2018 [employer].

## 2018-08-06 NOTE — Progress Notes (Signed)
LOW-RISK PREGNANCY VISIT Patient name: Lindsay Blackwell MRN [id number]  Date of birth: 2001/03/05 Chief Complaint:   Routine Prenatal Visit (bleeding during and after sex)  History of Present Illness:   Lindsay Blackwell is a 17 y.o. G1P0000 female at [redacted]w[redacted]d with an Estimated Date of Delivery: 09/11/18 being seen today for ongoing management of a low-risk pregnancy.  Today she reports bleeding w/ sex yesterday, she didn't notice it- bf did. States it was bright red and all between legs and on thighs. Contractions: Not present. Vag. Bleeding: None.  Movement: Present. denies leaking of fluid. Review of Systems:   Pertinent items are noted in HPI Denies abnormal vaginal discharge w/ itching/odor/irritation, headaches, visual changes, shortness of breath, chest pain, abdominal pain, severe nausea/vomiting, or problems with urination or bowel movements unless otherwise stated above. Pertinent History Reviewed:  Reviewed past medical,surgical, social, obstetrical and family history.  Reviewed problem list, medications and allergies. Physical Assessment:   Vitals:   08/06/18 1000  BP: 115/71  Pulse: 85  Weight: 141 lb 8 oz (64.2 kg)  Body mass index is 25.07 kg/m.        Physical Examination:   General appearance: Well appearing, and in no distress  Mental status: Alert, oriented to person, place, and time  Skin: Warm & dry  Cardiovascular: Normal heart rate noted  Respiratory: Normal respiratory effort, no distress  Abdomen: Soft, gravid, nontender  Pelvic: spec exam: cx visually closed, small amt dark brown nonodorus d/c         Extremities: Edema: None  Fetal Status: Fetal Heart Rate (bpm): 145 Fundal Height: 32 cm Movement: Present    Results for orders placed or performed in visit on 08/06/18 (from the past 24 hour(s))  POC Urinalysis Dipstick OB   Collection Time: 08/06/18 10:05 AM  Result Value Ref Range   Color, UA     Clarity, UA     Glucose, UA Negative Negative     Bilirubin, UA     Ketones, UA neg    Spec Grav, UA     Blood, UA trace    pH, UA     POC Protein UA Negative Negative, Trace   Urobilinogen, UA     Nitrite, UA neg    Leukocytes, UA Negative Negative   Appearance     Odor    POCT Wet Prep [facility])   Collection Time: 08/06/18 12:18 PM  Result Value Ref Range   Source Wet Prep POC vaginal    WBC, Wet Prep HPF POC few    Bacteria Wet Prep HPF POC Few Few   BACTERIA WET PREP MORPHOLOGY POC     Clue Cells Wet Prep HPF POC None None   Clue Cells Wet Prep Whiff POC Negative Whiff    Yeast Wet Prep HPF POC None None   KOH Wet Prep POC     Trichomonas Wet Prep HPF POC Absent Absent    Assessment & Plan:  1) Low-risk pregnancy G1P0000 at [redacted]w[redacted]d with an Estimated Date of Delivery: 09/11/18   2) Bleeding w/ sex, resolved, neg wet prep, will send gc/ct, pelvic rest x 7d from any sign of blood   Meds: No orders of the defined types were placed in this encounter.  Labs/procedures today: spec exam, wet prep  Plan:  Continue routine obstetrical care   Reviewed: Preterm labor symptoms and general obstetric precautions including but not limited to vaginal bleeding, contractions, leaking of fluid and fetal movement were reviewed in  detail with the patient.  All questions were answered  Follow-up: Return in about 2 weeks (around 08/20/2018) for [place].  Orders Placed This Encounter  Procedures  . GC/Chlamydia Probe Amp  . POC Urinalysis Dipstick OB  . POCT Wet Prep [facility])   Roma Schanz CNM, [facility] 08/06/2018 12:19 PM

## 2018-08-07 ENCOUNTER — Telehealth: Payer: Self-pay | Admitting: Women's Health

## 2018-08-07 NOTE — Telephone Encounter (Signed)
Patient called stating that she would like a call back from [facility], Patient did not state the reason for the call. Please contact pt

## 2018-08-07 NOTE — Telephone Encounter (Signed)
Patient states she woke up this morning and noticed dark brown blood when she went to the bathroom.  No cramping or leaking noted, baby is active.  Informed patient dark brown was old blood and was expected since she had bright red bleeding. Advised if she started having bright red bleeding again, leaking or change in fetal movement, to call us or go to [facility].  Verbalized understanding.

## 2018-08-08 LAB — GC/CHLAMYDIA PROBE AMP
Chlamydia trachomatis, NAA: NEGATIVE
Neisseria gonorrhoeae by PCR: NEGATIVE

## 2018-08-20 ENCOUNTER — Encounter: Payer: Self-pay | Admitting: Women's Health

## 2018-08-20 ENCOUNTER — Other Ambulatory Visit: Payer: Self-pay

## 2018-08-20 ENCOUNTER — Ambulatory Visit (INDEPENDENT_AMBULATORY_CARE_PROVIDER_SITE_OTHER): Payer: BLUE CROSS/BLUE SHIELD | Admitting: Women's Health

## 2018-08-20 VITALS — BP 119/74 | HR 73 | Wt 146.0 lb

## 2018-08-20 DIAGNOSIS — Z331 Pregnant state, incidental: Secondary | ICD-10-CM

## 2018-08-20 DIAGNOSIS — Z3403 Encounter for supervision of normal first pregnancy, third trimester: Secondary | ICD-10-CM

## 2018-08-20 DIAGNOSIS — Z1389 Encounter for screening for other disorder: Secondary | ICD-10-CM

## 2018-08-20 DIAGNOSIS — Z3A36 36 weeks gestation of pregnancy: Secondary | ICD-10-CM

## 2018-08-20 LAB — POCT URINALYSIS DIPSTICK OB
Blood, UA: NEGATIVE
Glucose, UA: NEGATIVE
KETONES UA: NEGATIVE
LEUKOCYTES UA: NEGATIVE
NITRITE UA: NEGATIVE
PROTEIN: NEGATIVE

## 2018-08-20 NOTE — Patient Instructions (Signed)
Henderson Cloud, I greatly value your feedback.  If you receive a survey following your visit with [place] today, we appreciate you taking the time to fill it out.  Thanks, Knute Neu, CNM, WHNP-BC   Call the office [phone]) or go to [facility] if:  You begin to have strong, frequent contractions  Your water breaks.  Sometimes it is a big gush of fluid, sometimes it is just a trickle that keeps getting your panties wet or running down your legs  You have vaginal bleeding.  It is normal to have a small amount of spotting if your cervix was checked.   You don't feel your baby moving like normal.  If you don't, get you something to eat and drink and lay down and focus on feeling your baby move.  You should feel at least 10 movements in 2 hours.  If you don't, you should call the office or go to [place] Contractions Contractions of the uterus can occur throughout pregnancy, but they are not always a sign that you are in labor. You may have practice contractions called Braxton Hicks contractions. These false labor contractions are sometimes confused with true labor. What are [place] contractions? Braxton Hicks contractions are tightening movements that occur in the muscles of the uterus before labor. Unlike true labor contractions, these contractions do not result in opening (dilation) and thinning of the cervix. Toward the end of pregnancy (32-34 weeks), Braxton Hicks contractions can happen more often and may become stronger. These contractions are sometimes difficult to tell apart from true labor because they can be very uncomfortable. You should not feel embarrassed if you go to the hospital with false labor. Sometimes, the only way to tell if you are in true labor is for your health care provider to look for changes in the cervix. The health care provider will do a physical exam and may monitor your contractions. If you are not in true labor, the exam  should show that your cervix is not dilating and your water has not broken. If there are other health problems associated with your pregnancy, it is completely safe for you to be sent home with false labor. You may continue to have Braxton Hicks contractions until you go into true labor. How to tell the difference between true labor and false labor True labor  Contractions last 30-70 seconds.  Contractions become very regular.  Discomfort is usually felt in the top of the uterus, and it spreads to the lower abdomen and low back.  Contractions do not go away with walking.  Contractions usually become more intense and increase in frequency.  The cervix dilates and gets thinner. False labor  Contractions are usually shorter and not as strong as true labor contractions.  Contractions are usually irregular.  Contractions are often felt in the front of the lower abdomen and in the groin.  Contractions may go away when you walk around or change positions while lying down.  Contractions get weaker and are shorter-lasting as time goes on.  The cervix usually does not dilate or become thin. Follow these instructions at home:  Take over-the-counter and prescription medicines only as told by your health care provider.  Keep up with your usual exercises and follow other instructions from your health care provider.  Eat and drink lightly if you think you are going into labor.  If Braxton Hicks contractions are making you uncomfortable: ? Change your position from lying  down or resting to walking, or change from walking to resting. ? Sit and rest in a tub of warm water. ? Drink enough fluid to keep your urine pale yellow. Dehydration may cause these contractions. ? Do slow and deep breathing several times an hour.  Keep all follow-up prenatal visits as told by your health care provider. This is important. Contact a health care provider if:  You have a fever.  You have continuous pain  in your abdomen. Get help right away if:  Your contractions become stronger, more regular, and closer together.  You have fluid leaking or gushing from your vagina.  You pass blood-tinged mucus (bloody show).  You have bleeding from your vagina.  You have low back pain that you never had before.  You feel your baby's head pushing down and causing pelvic pressure.  Your baby is not moving inside you as much as it used to. Summary  Contractions that occur before labor are called Braxton Hicks contractions, false labor, or practice contractions.  Braxton Hicks contractions are usually shorter, weaker, farther apart, and less regular than true labor contractions. True labor contractions usually become progressively stronger and regular and they become more frequent.  Manage discomfort from [facility] contractions by changing position, resting in a warm bath, drinking plenty of water, or practicing deep breathing. This information is not intended to replace advice given to you by your health care provider. Make sure you discuss any questions you have with your health care provider. Document Released: 03/07/2017 Document Revised: 03/07/2017 Document Reviewed: 03/07/2017 Elsevier Interactive Patient Education  2018 [employer].

## 2018-08-20 NOTE — Progress Notes (Signed)
   LOW-RISK PREGNANCY VISIT Patient name: Lindsay Blackwell MRN [id number]  Date of birth: 2001/10/20 Chief Complaint:   Routine Prenatal Visit (gbs today)  History of Present Illness:   Lindsay Blackwell is a 17 y.o. G1P0000 female at [redacted]w[redacted]d with an Estimated Date of Delivery: 09/11/18 being seen today for ongoing management of a low-risk pregnancy.  Today she reports no complaints. Contractions: Irregular. Vag. Bleeding: None.  Movement: Present. denies leaking of fluid. Review of Systems:   Pertinent items are noted in HPI Denies abnormal vaginal discharge w/ itching/odor/irritation, headaches, visual changes, shortness of breath, chest pain, abdominal pain, severe nausea/vomiting, or problems with urination or bowel movements unless otherwise stated above. Pertinent History Reviewed:  Reviewed past medical,surgical, social, obstetrical and family history.  Reviewed problem list, medications and allergies. Physical Assessment:   Vitals:   08/20/18 1010  BP: 119/74  Pulse: 73  Weight: 146 lb (66.2 kg)  Body mass index is 25.86 kg/m.        Physical Examination:   General appearance: Well appearing, and in no distress  Mental status: Alert, oriented to person, place, and time  Skin: Warm & dry  Cardiovascular: Normal heart rate noted  Respiratory: Normal respiratory effort, no distress  Abdomen: Soft, gravid, nontender  Pelvic: Cervical exam performed  Dilation: 2.5 Effacement (%): 80 Station: Ballotable  Extremities: Edema: None  Fetal Status: Fetal Heart Rate (bpm): 140 Fundal Height: 36 cm Movement: Present Presentation: Vertex  Results for orders placed or performed in visit on 08/20/18 (from the past 24 hour(s))  POC Urinalysis Dipstick OB   Collection Time: 08/20/18 10:10 AM  Result Value Ref Range   Color, UA     Clarity, UA     Glucose, UA Negative Negative   Bilirubin, UA     Ketones, UA neg    Spec Grav, UA     Blood, UA neg    pH, UA     POC Protein UA  Negative Negative, Trace   Urobilinogen, UA     Nitrite, UA neg    Leukocytes, UA Negative Negative   Appearance     Odor      Assessment & Plan:  1) Low-risk pregnancy G1P0000 at [redacted]w[redacted]d with an Estimated Date of Delivery: 09/11/18    Meds: No orders of the defined types were placed in this encounter.  Labs/procedures today: gbs, sve, had neg gc/ct 2wks ago  Plan:  Continue routine obstetrical care   Reviewed: Term labor symptoms and general obstetric precautions including but not limited to vaginal bleeding, contractions, leaking of fluid and fetal movement were reviewed in detail with the patient.  All questions were answered  Follow-up: Return in about 1 week (around 08/27/2018) for [place].  Orders Placed This Encounter  Procedures  . Culture, beta strep (group b only)  . POC Urinalysis Dipstick OB   Roma Schanz CNM, [facility] 08/20/2018 10:30 AM

## 2018-08-24 LAB — CULTURE, BETA STREP (GROUP B ONLY): Strep Gp B Culture: NEGATIVE

## 2018-08-28 ENCOUNTER — Ambulatory Visit (INDEPENDENT_AMBULATORY_CARE_PROVIDER_SITE_OTHER): Payer: BLUE CROSS/BLUE SHIELD | Admitting: Advanced Practice Midwife

## 2018-08-28 VITALS — BP 133/86 | HR 81 | Wt 148.0 lb

## 2018-08-28 DIAGNOSIS — Z3403 Encounter for supervision of normal first pregnancy, third trimester: Secondary | ICD-10-CM

## 2018-08-28 DIAGNOSIS — Z1389 Encounter for screening for other disorder: Secondary | ICD-10-CM

## 2018-08-28 DIAGNOSIS — Z331 Pregnant state, incidental: Secondary | ICD-10-CM

## 2018-08-28 DIAGNOSIS — Z3A38 38 weeks gestation of pregnancy: Secondary | ICD-10-CM

## 2018-08-28 LAB — POCT URINALYSIS DIPSTICK OB
Blood, UA: NEGATIVE
GLUCOSE, UA: NEGATIVE
KETONES UA: NEGATIVE
Leukocytes, UA: NEGATIVE
NITRITE UA: NEGATIVE
PROTEIN: NEGATIVE

## 2018-08-28 NOTE — Progress Notes (Signed)
  G1P0000 [redacted]w[redacted]d Estimated Date of Delivery: 09/11/18  Blood pressure 133/86, pulse 81, weight 148 lb (67.1 kg), last menstrual period 12/05/2017.   BP weight and urine results all reviewed and noted.  Please refer to the obstetrical flow sheet for the fundal height and fetal heart rate documentation:  Patient reports good fetal movement, denies any bleeding and no rupture of membranes symptoms or regular contractions. Patient is without complaints. All questions were answered.   Physical Assessment:   Vitals:   08/28/18 0855  BP: 133/86  Pulse: 81  Weight: 148 lb (67.1 kg)  Body mass index is 26.22 kg/m.        Physical Examination:   General appearance: Well appearing, and in no distress  Mental status: Alert, oriented to person, place, and time  Skin: Warm & dry  Cardiovascular: Normal heart rate noted  Respiratory: Normal respiratory effort, no distress  Abdomen: Soft, gravid, nontender  Pelvic: Cervical exam performed  Dilation: 3 Effacement (%): 80 Station: -2  Extremities: Edema: None  Fetal Status: Fetal Heart Rate (bpm): 150 Fundal Height: 37 cm Movement: Present Presentation: Vertex  Results for orders placed or performed in visit on 08/28/18 (from the past 24 hour(s))  POC Urinalysis Dipstick OB   Collection Time: 08/28/18  8:58 AM  Result Value Ref Range   Color, UA     Clarity, UA     Glucose, UA Negative Negative   Bilirubin, UA     Ketones, UA neg    Spec Grav, UA     Blood, UA neg    pH, UA     POC Protein UA Negative Negative, Trace   Urobilinogen, UA     Nitrite, UA neg    Leukocytes, UA Negative Negative   Appearance     Odor       Orders Placed This Encounter  Procedures  . POC Urinalysis Dipstick OB    Plan:  Continued routine obstetrical care, GHTN warning signs given  Return in about 1 week (around 09/04/2018) for [facility].

## 2018-08-28 NOTE — Patient Instructions (Addendum)
Hypertension During Pregnancy °Hypertension, commonly called high blood pressure, is when the force of blood pumping through your arteries is too strong. Arteries are blood vessels that carry blood from the heart throughout the body. Hypertension during pregnancy can cause problems for you and your baby. Your baby may be born early (prematurely) or may not weigh as much as he or she should at birth. Very bad cases of hypertension during pregnancy can be life-threatening. °Different types of hypertension can occur during pregnancy. These include: °· Chronic hypertension. This happens when: °? You have hypertension before pregnancy and it continues during pregnancy. °? You develop hypertension before you are [redacted] weeks pregnant, and it continues during pregnancy. °· Gestational hypertension. This is hypertension that develops after the 20th week of pregnancy. °· Preeclampsia, also called toxemia of pregnancy. This is a very serious type of hypertension that develops only during pregnancy. It affects the whole body, and it can be very dangerous for you and your baby. ° °Gestational hypertension and preeclampsia usually go away within 6 weeks after your baby is born. Women who have hypertension during pregnancy have a greater chance of developing hypertension later in life or during future pregnancies. °What are the causes? °The exact cause of hypertension is not known. °What increases the risk? °There are certain factors that make it more likely for you to develop hypertension during pregnancy. These include: °· Having hypertension during a previous pregnancy or prior to pregnancy. °· Being overweight. °· Being older than age 40. °· Being pregnant for the first time or being pregnant with more than one baby. °· Becoming pregnant using fertilization methods such as IVF (in vitro fertilization). °· Having diabetes, kidney problems, or systemic lupus erythematosus. °· Having a family history of hypertension. ° °What are the  signs or symptoms? °Chronic hypertension and gestational hypertension rarely cause symptoms. Preeclampsia causes symptoms, which may include: °· Increased protein in your urine. Your health care provider will check for this at every visit before you give birth (prenatal visit). °· Severe headaches. °· Sudden weight gain. °· Swelling of the hands, face, legs, and feet. °· Nausea and vomiting. °· Vision problems, such as blurred or double vision. °· Numbness in the face, arms, legs, and feet. °· Dizziness. °· Slurred speech. °· Sensitivity to bright lights. °· Abdominal pain. °· Convulsions. ° °How is this diagnosed? °You may be diagnosed with hypertension during a routine prenatal exam. At each prenatal visit, you may: °· Have a urine test to check for high amounts of protein in your urine. °· Have your blood pressure checked. A blood pressure reading is recorded as two numbers, such as "120 over 80" (or 120/80). The first ("top") number is called the systolic pressure. It is a measure of the pressure in your arteries when your heart beats. The second ("bottom") number is called the diastolic pressure. It is a measure of the pressure in your arteries as your heart relaxes between beats. Blood pressure is measured in a unit called mm Hg. A normal blood pressure reading is: °? Systolic: below 120. °? Diastolic: below 80. ° °The type of hypertension that you are diagnosed with depends on your test results and when your symptoms developed. °· Chronic hypertension is usually diagnosed before 20 weeks of pregnancy. °· Gestational hypertension is usually diagnosed after 20 weeks of pregnancy. °· Hypertension with high amounts of protein in the urine is diagnosed as preeclampsia. °· Blood pressure measurements that stay above 160 systolic, or above 110 diastolic, are   signs of severe preeclampsia. ° °How is this treated? °Treatment for hypertension during pregnancy varies depending on the type of hypertension you have and how  serious it is. °· If you take medicines called ACE inhibitors to treat chronic hypertension, you may need to switch medicines. ACE inhibitors should not be taken during pregnancy. °· If you have gestational hypertension, you may need to take blood pressure medicine. °· If you are at risk for preeclampsia, your health care provider may recommend that you take a low-dose aspirin every day to prevent high blood pressure during your pregnancy. °· If you have severe preeclampsia, you may need to be hospitalized so you and your baby can be monitored closely. You may also need to take medicine (magnesium sulfate) to prevent seizures and to lower blood pressure. This medicine may be given as an injection or through an IV tube. °· In some cases, if your condition gets worse, you may need to deliver your baby early. ° °Follow these instructions at home: °Eating and drinking °· Drink enough fluid to keep your urine clear or pale yellow. °· Eat a healthy diet that is low in salt (sodium). Do not add salt to your food. Check food labels to see how much sodium a food or beverage contains. °Lifestyle °· Do not use any products that contain nicotine or tobacco, such as cigarettes and e-cigarettes. If you need help quitting, ask your health care provider. °· Do not use alcohol. °· Avoid caffeine. °· Avoid stress as much as possible. Rest and get plenty of sleep. °General instructions °· Take over-the-counter and prescription medicines only as told by your health care provider. °· While lying down, lie on your left side. This keeps pressure off your baby. °· While sitting or lying down, raise (elevate) your feet. Try putting some pillows under your lower legs. °· Exercise regularly. Ask your health care provider what kinds of exercise are best for you. °· Keep all prenatal and follow-up visits as told by your health care provider. This is important. °Contact a health care provider if: °· You have symptoms that your health care  provider told you may require more treatment or monitoring, such as: °? Fever. °? Vomiting. °? Headache. °Get help right away if: °· You have severe abdominal pain or vomiting that does not get better with treatment. °· You suddenly develop swelling in your hands, ankles, or face. °· You gain 4 lbs (1.8 kg) or more in 1 week. °· You develop vaginal bleeding, or you have blood in your urine. °· You do not feel your baby moving as much as usual. °· You have blurred or double vision. °· You have muscle twitching or sudden tightening (spasms). °· You have shortness of breath. °· Your lips or fingernails turn blue. °This information is not intended to replace advice given to you by your health care provider. Make sure you discuss any questions you have with your health care provider. °Document Released: 07/10/2011 Document Revised: 05/11/2016 Document Reviewed: 04/06/2016 °Elsevier Interactive Patient Education © 2018 [employer] ° °

## 2018-09-04 ENCOUNTER — Encounter: Payer: Self-pay | Admitting: Family Medicine

## 2018-09-04 ENCOUNTER — Telehealth (HOSPITAL_COMMUNITY): Payer: Self-pay | Admitting: *Deleted

## 2018-09-04 ENCOUNTER — Ambulatory Visit (INDEPENDENT_AMBULATORY_CARE_PROVIDER_SITE_OTHER): Payer: BLUE CROSS/BLUE SHIELD | Admitting: Family Medicine

## 2018-09-04 ENCOUNTER — Encounter (HOSPITAL_COMMUNITY): Payer: Self-pay | Admitting: *Deleted

## 2018-09-04 VITALS — BP 127/84 | HR 84 | Wt 146.8 lb

## 2018-09-04 DIAGNOSIS — O368130 Decreased fetal movements, third trimester, not applicable or unspecified: Secondary | ICD-10-CM

## 2018-09-04 DIAGNOSIS — Z1389 Encounter for screening for other disorder: Secondary | ICD-10-CM

## 2018-09-04 DIAGNOSIS — Z3403 Encounter for supervision of normal first pregnancy, third trimester: Secondary | ICD-10-CM

## 2018-09-04 DIAGNOSIS — Z3A39 39 weeks gestation of pregnancy: Secondary | ICD-10-CM

## 2018-09-04 DIAGNOSIS — Z331 Pregnant state, incidental: Secondary | ICD-10-CM

## 2018-09-04 LAB — POCT URINALYSIS DIPSTICK OB
Glucose, UA: NEGATIVE
Ketones, UA: NEGATIVE
NITRITE UA: NEGATIVE
POC,PROTEIN,UA: NEGATIVE
RBC UA: NEGATIVE

## 2018-09-04 NOTE — Patient Instructions (Signed)
Come to the MAU (maternity admission unit) for 1) Strong contractions every 2-3 minutes for at least 1 hour that do not go away when you drink water or take a warm shower. These contractions will be so strong all you can do is breath through them 2) Vaginal bleeding- anything more than spotting 3) Loss of fluid like you broke your water 4) Decreased movement of your baby    Breastfeeding Choosing to breastfeed is one of the best decisions you can make for yourself and your baby. A change in hormones during pregnancy causes your breasts to make breast milk in your milk-producing glands. Hormones prevent breast milk from being released before your baby is born. They also prompt milk flow after birth. Once breastfeeding has begun, thoughts of your baby, as well as his or her sucking or crying, can stimulate the release of milk from your milk-producing glands. Benefits of breastfeeding Research shows that breastfeeding offers many health benefits for infants and mothers. It also offers a cost-free and convenient way to feed your baby. For your baby  Your first milk (colostrum) helps your baby's digestive system to function better.  Special cells in your milk (antibodies) help your baby to fight off infections.  Breastfed babies are less likely to develop asthma, allergies, obesity, or type 2 diabetes. They are also at lower risk for sudden infant death syndrome (SIDS).  Nutrients in breast milk are better able to meet your baby's needs compared to infant formula.  Breast milk improves your baby's brain development. For you  Breastfeeding helps to create a very special bond between you and your baby.  Breastfeeding is convenient. Breast milk costs nothing and is always available at the correct temperature.  Breastfeeding helps to burn calories. It helps you to lose the weight that you gained during pregnancy.  Breastfeeding makes your uterus return faster to its size before pregnancy. It  also slows bleeding (lochia) after you give birth.  Breastfeeding helps to lower your risk of developing type 2 diabetes, osteoporosis, rheumatoid arthritis, cardiovascular disease, and breast, ovarian, uterine, and endometrial cancer later in life. Breastfeeding basics Starting breastfeeding  Find a comfortable place to sit or lie down, with your neck and back well-supported.  Place a pillow or a rolled-up blanket under your baby to bring him or her to the level of your breast (if you are seated). Nursing pillows are specially designed to help support your arms and your baby while you breastfeed.  Make sure that your baby's tummy (abdomen) is facing your abdomen.  Gently massage your breast. With your fingertips, massage from the outer edges of your breast inward toward the nipple. This encourages milk flow. If your milk flows slowly, you may need to continue this action during the feeding.  Support your breast with 4 fingers underneath and your thumb above your nipple (make the letter "C" with your hand). Make sure your fingers are well away from your nipple and your baby's mouth.  Stroke your baby's lips gently with your finger or nipple.  When your baby's mouth is open wide enough, quickly bring your baby to your breast, placing your entire nipple and as much of the areola as possible into your baby's mouth. The areola is the colored area around your nipple. ? More areola should be visible above your baby's upper lip than below the lower lip. ? Your baby's lips should be opened and extended outward (flanged) to ensure an adequate, comfortable latch. ? Your baby's tongue should be  between his or her lower gum and your breast.  Make sure that your baby's mouth is correctly positioned around your nipple (latched). Your baby's lips should create a seal on your breast and be turned out (everted).  It is common for your baby to suck about 2-3 minutes in order to start the flow of breast  milk. Latching Teaching your baby how to latch onto your breast properly is very important. An improper latch can cause nipple pain, decreased milk supply, and poor weight gain in your baby. Also, if your baby is not latched onto your nipple properly, he or she may swallow some air during feeding. This can make your baby fussy. Burping your baby when you switch breasts during the feeding can help to get rid of the air. However, teaching your baby to latch on properly is still the best way to prevent fussiness from swallowing air while breastfeeding. Signs that your baby has successfully latched onto your nipple  Silent tugging or silent sucking, without causing you pain. Infant's lips should be extended outward (flanged).  Swallowing heard between every 3-4 sucks once your milk has started to flow (after your let-down milk reflex occurs).  Muscle movement above and in front of his or her ears while sucking.  Signs that your baby has not successfully latched onto your nipple  Sucking sounds or smacking sounds from your baby while breastfeeding.  Nipple pain.  If you think your baby has not latched on correctly, slip your finger into the corner of your baby's mouth to break the suction and place it between your baby's gums. Attempt to start breastfeeding again. Signs of successful breastfeeding Signs from your baby  Your baby will gradually decrease the number of sucks or will completely stop sucking.  Your baby will fall asleep.  Your baby's body will relax.  Your baby will retain a small amount of milk in his or her mouth.  Your baby will let go of your breast by himself or herself.  Signs from you  Breasts that have increased in firmness, weight, and size 1-3 hours after feeding.  Breasts that are softer immediately after breastfeeding.  Increased milk volume, as well as a change in milk consistency and color by the fifth day of breastfeeding.  Nipples that are not sore,  cracked, or bleeding.  Signs that your baby is getting enough milk  Wetting at least 1-2 diapers during the first 24 hours after birth.  Wetting at least 5-6 diapers every 24 hours for the first week after birth. The urine should be clear or pale yellow by the age of 5 days.  Wetting 6-8 diapers every 24 hours as your baby continues to grow and develop.  At least 3 stools in a 24-hour period by the age of 5 days. The stool should be soft and yellow.  At least 3 stools in a 24-hour period by the age of 7 days. The stool should be seedy and yellow.  No loss of weight greater than 10% of birth weight during the first 3 days of life.  Average weight gain of 4-7 oz (113-198 g) per week after the age of 4 days.  Consistent daily weight gain by the age of 5 days, without weight loss after the age of 2 weeks. After a feeding, your baby may spit up a small amount of milk. This is normal. Breastfeeding frequency and duration Frequent feeding will help you make more milk and can prevent sore nipples and extremely full  breasts (breast engorgement). Breastfeed when you feel the need to reduce the fullness of your breasts or when your baby shows signs of hunger. This is called "breastfeeding on demand." Signs that your baby is hungry include:  Increased alertness, activity, or restlessness.  Movement of the head from side to side.  Opening of the mouth when the corner of the mouth or cheek is stroked (rooting).  Increased sucking sounds, smacking lips, cooing, sighing, or squeaking.  Hand-to-mouth movements and sucking on fingers or hands.  Fussing or crying.  Avoid introducing a pacifier to your baby in the first 4-6 weeks after your baby is born. After this time, you may choose to use a pacifier. Research has shown that pacifier use during the first year of a baby's life decreases the risk of sudden infant death syndrome (SIDS). Allow your baby to feed on each breast as long as he or she  wants. When your baby unlatches or falls asleep while feeding from the first breast, offer the second breast. Because newborns are often sleepy in the first few weeks of life, you may need to awaken your baby to get him or her to feed. Breastfeeding times will vary from baby to baby. However, the following rules can serve as a guide to help you make sure that your baby is properly fed:  Newborns (babies 91 weeks of age or younger) may breastfeed every 1-3 hours.  Newborns should not go without breastfeeding for longer than 3 hours during the day or 5 hours during the night.  You should breastfeed your baby a minimum of 8 times in a 24-hour period.  Breast milk pumping Pumping and storing breast milk allows you to make sure that your baby is exclusively fed your breast milk, even at times when you are unable to breastfeed. This is especially important if you go back to work while you are still breastfeeding, or if you are not able to be present during feedings. Your lactation consultant can help you find a method of pumping that works best for you and give you guidelines about how long it is safe to store breast milk. Caring for your breasts while you breastfeed Nipples can become dry, cracked, and sore while breastfeeding. The following recommendations can help keep your breasts moisturized and healthy:  Avoid using soap on your nipples.  Wear a supportive bra designed especially for nursing. Avoid wearing underwire-style bras or extremely tight bras (sports bras).  Air-dry your nipples for 3-4 minutes after each feeding.  Use only cotton bra pads to absorb leaked breast milk. Leaking of breast milk between feedings is normal.  Use lanolin on your nipples after breastfeeding. Lanolin helps to maintain your skin's normal moisture barrier. Pure lanolin is not harmful (not toxic) to your baby. You may also hand express a few drops of breast milk and gently massage that milk into your nipples and  allow the milk to air-dry.  In the first few weeks after giving birth, some women experience breast engorgement. Engorgement can make your breasts feel heavy, warm, and tender to the touch. Engorgement peaks within 3-5 days after you give birth. The following recommendations can help to ease engorgement:  Completely empty your breasts while breastfeeding or pumping. You may want to start by applying warm, moist heat (in the shower or with warm, water-soaked hand towels) just before feeding or pumping. This increases circulation and helps the milk flow. If your baby does not completely empty your breasts while breastfeeding, pump any  extra milk after he or she is finished.  Apply ice packs to your breasts immediately after breastfeeding or pumping, unless this is too uncomfortable for you. To do this: ? Put ice in a plastic bag. ? Place a towel between your skin and the bag. ? Leave the ice on for 20 minutes, 2-3 times a day.  Make sure that your baby is latched on and positioned properly while breastfeeding.  If engorgement persists after 48 hours of following these recommendations, contact your health care provider or a Science writer. Overall health care recommendations while breastfeeding  Eat 3 healthy meals and 3 snacks every day. Well-nourished mothers who are breastfeeding need an additional 450-500 calories a day. You can meet this requirement by increasing the amount of a balanced diet that you eat.  Drink enough water to keep your urine pale yellow or clear.  Rest often, relax, and continue to take your prenatal vitamins to prevent fatigue, stress, and low vitamin and mineral levels in your body (nutrient deficiencies).  Do not use any products that contain nicotine or tobacco, such as cigarettes and e-cigarettes. Your baby may be harmed by chemicals from cigarettes that pass into breast milk and exposure to secondhand smoke. If you need help quitting, ask your health care  provider.  Avoid alcohol.  Do not use illegal drugs or marijuana.  Talk with your health care provider before taking any medicines. These include over-the-counter and prescription medicines as well as vitamins and herbal supplements. Some medicines that may be harmful to your baby can pass through breast milk.  It is possible to become pregnant while breastfeeding. If birth control is desired, ask your health care provider about options that will be safe while breastfeeding your baby. Where to find more information: [employer]: www.llli.org Contact a health care provider if:  You feel like you want to stop breastfeeding or have become frustrated with breastfeeding.  Your nipples are cracked or bleeding.  Your breasts are red, tender, or warm.  You have: ? Painful breasts or nipples. ? A swollen area on either breast. ? A fever or chills. ? Nausea or vomiting. ? Drainage other than breast milk from your nipples.  Your breasts do not become full before feedings by the fifth day after you give birth.  You feel sad and depressed.  Your baby is: ? Too sleepy to eat well. ? Having trouble sleeping. ? More than 49 week old and wetting fewer than 6 diapers in a 24-hour period. ? Not gaining weight by 16 days of age.  Your baby has fewer than 3 stools in a 24-hour period.  Your baby's skin or the white parts of his or her eyes become yellow. Get help right away if:  Your baby is overly tired (lethargic) and does not want to wake up and feed.  Your baby develops an unexplained fever. Summary  Breastfeeding offers many health benefits for infant and mothers.  Try to breastfeed your infant when he or she shows early signs of hunger.  Gently tickle or stroke your baby's lips with your finger or nipple to allow the baby to open his or her mouth. Bring the baby to your breast. Make sure that much of the areola is in your baby's mouth. Offer one side and burp the  baby before you offer the other side.  Talk with your health care provider or lactation consultant if you have questions or you face problems as you breastfeed. This information is  not intended to replace advice given to you by your health care provider. Make sure you discuss any questions you have with your health care provider. Document Released: 10/22/2005 Document Revised: 11/23/2016 Document Reviewed: 11/23/2016 Elsevier Interactive Patient Education  [employer].

## 2018-09-04 NOTE — Progress Notes (Signed)
Induction Assessment Scheduling Form Fax to [facility]:  [id number]  Lindsay Blackwell                                                                                   DOB:  Aug 02, 2001                                                            MRN:  [id number]                                                                                       Provider:  [facility]  GP:  [id number]                                                            Estimated Date of Delivery: 09/11/18  Dating Criteria: L/10    Medical Indications for induction:  postdates Admission Date/Time:  09/18/2018 at 0630 AM Gestational age on admission:  [redacted]w[redacted]d   Filed Weights   09/04/18 1021  Weight: 146 lb 12.8 oz (66.6 kg)   HIV:  Non Reactive (08/14 0919) GBS:  NEGATIVE  3 cm dilated, 80 effaced, -2 station, consistency soft, presenting part vertex   Method of induction(proposed):  Cytotec  Scheduling Provider Signature:  Caren Macadam, MD                                            Today's Date:  09/04/2018

## 2018-09-04 NOTE — Telephone Encounter (Signed)
Preadmission screen  

## 2018-09-04 NOTE — Progress Notes (Signed)
    PRENATAL VISIT NOTE  Subjective:  Lindsay Blackwell is a 17 y.o. G1P0000 at [redacted]w[redacted]d being seen today for ongoing prenatal care.  She is currently monitored for the following issues for this low-risk pregnancy and has Supervision of normal first pregnancy and Asymptomatic bacteriuria during pregnancy in first trimester on their problem list.  Patient reports no complaints.  Contractions: Irregular. Vag. Bleeding: None.  Movement: (!) Decreased. Denies leaking of fluid.   The following portions of the patient's history were reviewed and updated as appropriate: allergies, current medications, past family history, past medical history, past social history, past surgical history and problem list. Problem list updated.  Objective:   Vitals:   09/04/18 1021  BP: 127/84  Pulse: 84  Weight: 146 lb 12.8 oz (66.6 kg)    Fetal Status: Fetal Heart Rate (bpm): 145 Fundal Height: 39 cm Movement: (!) Decreased  Presentation: Vertex  General:  Alert, oriented and cooperative. Patient is in no acute distress.  Skin: Skin is warm and dry. No rash noted.   Cardiovascular: Normal heart rate noted  Respiratory: Normal respiratory effort, no problems with respiration noted  Abdomen: Soft, gravid, appropriate for gestational age.  Pain/Pressure: Present     Pelvic: Cervical exam performed Dilation: 3 Effacement (%): 80 Station: -2 Swept membranes  Extremities: Normal range of motion.  Edema: Trace  Mental Status: Normal mood and affect. Normal behavior. Normal judgment and thought content.    NST performed 145/mod/+accels, no decels Occasional ctx  Assessment and Plan:  Pregnancy: G1P0000 at [redacted]w[redacted]d  1. Pregnant state, incidental - POC Urinalysis Dipstick OB  2.  Encounter for supervision of normal first pregnancy in third trimester IOL requested for 11/14, orders for admission placed. Swept membranes  3. Decreased fetal movements in third trimester, single or unspecified fetus NST  Reactive Counseled on kick/movement counts     Term labor symptoms and general obstetric precautions including but not limited to vaginal bleeding, contractions, leaking of fluid and fetal movement were reviewed in detail with the patient. Please refer to After Visit Summary for other counseling recommendations.  Return in about 1 week (around 09/11/2018) for Routine prenatal care.  Future Appointments  Date Time Provider [place]  09/18/2018  6:30 AM [facility]ED ROOM [facility]SS[facility]ED None    Caren Macadam, MD

## 2018-09-06 ENCOUNTER — Encounter (HOSPITAL_COMMUNITY): Payer: Self-pay | Admitting: *Deleted

## 2018-09-06 ENCOUNTER — Inpatient Hospital Stay (HOSPITAL_COMMUNITY)
Admission: AD | Admit: 2018-09-06 | Discharge: 2018-09-08 | DRG: 807 | Disposition: A | Discharge status: Home / Self Care | Payer: Medicaid Other | Attending: Obstetrics and Gynecology | Admitting: Obstetrics and Gynecology

## 2018-09-06 DIAGNOSIS — O4290 Premature rupture of membranes, unspecified as to length of time between rupture and onset of labor, unspecified weeks of gestation: Secondary | ICD-10-CM | POA: Diagnosis present

## 2018-09-06 DIAGNOSIS — Z3403 Encounter for supervision of normal first pregnancy, third trimester: Secondary | ICD-10-CM

## 2018-09-06 DIAGNOSIS — Z3A39 39 weeks gestation of pregnancy: Secondary | ICD-10-CM

## 2018-09-06 DIAGNOSIS — O480 Post-term pregnancy: Secondary | ICD-10-CM | POA: Diagnosis present

## 2018-09-06 DIAGNOSIS — O4292 Full-term premature rupture of membranes, unspecified as to length of time between rupture and onset of labor: Secondary | ICD-10-CM | POA: Diagnosis present

## 2018-09-06 DIAGNOSIS — O4202 Full-term premature rupture of membranes, onset of labor within 24 hours of rupture: Secondary | ICD-10-CM | POA: Diagnosis not present

## 2018-09-06 LAB — TYPE AND SCREEN
ABO/RH(D): O POS
ANTIBODY SCREEN: NEGATIVE

## 2018-09-06 LAB — CBC
HCT: 38.1 % (ref 36.0–46.0)
HEMOGLOBIN: 13.3 g/dL (ref 12.0–15.0)
MCH: 31.7 pg (ref 26.0–34.0)
MCHC: 34.9 g/dL (ref 30.0–36.0)
MCV: 90.9 fL (ref 80.0–100.0)
PLATELETS: 134 10*3/uL — AB (ref 150–400)
RBC: 4.19 MIL/uL (ref 3.87–5.11)
RDW: 13.1 % (ref 11.5–15.5)
WBC: 10.6 10*3/uL — AB (ref 4.0–10.5)
nRBC: 0 % (ref 0.0–0.2)

## 2018-09-06 LAB — POCT FERN TEST: POCT Fern Test: POSITIVE

## 2018-09-06 MED ORDER — OXYCODONE-ACETAMINOPHEN 5-325 MG PO TABS: 2.0000 | ORAL_TABLET | ORAL | Status: DC | PRN

## 2018-09-06 MED ORDER — OXYTOCIN BOLUS FROM INFUSION
500.0000 mL | Freq: Once | INTRAVENOUS | Status: AC
  Administered 2018-09-06: 500 mL via INTRAVENOUS

## 2018-09-06 MED ORDER — SOD CITRATE-CITRIC ACID 500-334 MG/5ML PO SOLN: 30.0000 mL | ORAL | Status: DC | PRN

## 2018-09-06 MED ORDER — OXYTOCIN 40 UNITS IN LACTATED RINGERS INFUSION - SIMPLE MED
2.5000 [IU]/h | INTRAVENOUS | Status: DC
  Filled 2018-09-06: qty 1000

## 2018-09-06 MED ORDER — LACTATED RINGERS IV SOLN
INTRAVENOUS | Status: DC
  Administered 2018-09-06: 21:00:00 via INTRAVENOUS

## 2018-09-06 MED ORDER — FENTANYL CITRATE (PF) 100 MCG/2ML IJ SOLN
INTRAMUSCULAR | Status: AC
  Filled 2018-09-06: qty 2

## 2018-09-06 MED ORDER — LIDOCAINE HCL (PF) 1 % IJ SOLN
30.0000 mL | INTRAMUSCULAR | Status: DC | PRN
  Administered 2018-09-06: 30 mL via SUBCUTANEOUS
  Filled 2018-09-06: qty 30

## 2018-09-06 MED ORDER — FENTANYL CITRATE (PF) 100 MCG/2ML IJ SOLN
100.0000 ug | INTRAMUSCULAR | Status: DC | PRN
  Administered 2018-09-06 (×3): 100 ug via INTRAVENOUS
  Filled 2018-09-06 (×2): qty 2

## 2018-09-06 MED ORDER — LACTATED RINGERS IV SOLN
500.0000 mL | INTRAVENOUS | Status: DC | PRN
  Administered 2018-09-06: 1000 mL via INTRAVENOUS

## 2018-09-06 MED ORDER — ONDANSETRON HCL 4 MG/2ML IJ SOLN: 4.0000 mg | Freq: Four times a day (QID) | INTRAMUSCULAR | Status: DC | PRN

## 2018-09-06 MED ORDER — FENTANYL CITRATE (PF) 100 MCG/2ML IJ SOLN: 50.0000 ug | Freq: Once | INTRAMUSCULAR | Status: AC

## 2018-09-06 MED ORDER — ACETAMINOPHEN 325 MG PO TABS
650.0000 mg | ORAL_TABLET | ORAL | Status: DC | PRN
  Filled 2018-09-06: qty 2

## 2018-09-06 MED ORDER — OXYCODONE-ACETAMINOPHEN 5-325 MG PO TABS: 1.0000 | ORAL_TABLET | ORAL | Status: DC | PRN

## 2018-09-06 NOTE — H&P (Addendum)
LABOR AND DELIVERY ADMISSION HISTORY AND PHYSICAL NOTE  Lindsay Blackwell is a 17 y.o. female G1P0000 with IUP at [redacted]w[redacted]d by L/10 presenting for PROM.  She reports positive fetal movement today. She denies vaginal bleeding.  Prenatal History/Complications: PNC at [facility] Pregnancy complications:  - Decreased fetal movement, asymptomatic bacteriuria during 1st trimester (4/29 Cx+ Ecoli  rx keflex 03/07/18   POC 5/28 + Ecoli, Rx macrobid   POC neg)  Past Medical History: Past Medical History:  Diagnosis Date  . Medical history non-contributory     Past Surgical History: Past Surgical History:  Procedure Laterality Date  . NO PAST SURGERIES      Obstetrical History: OB History    Gravida  1   Para  0   Term  0   Preterm  0   AB  0   Living  0     SAB  0   TAB  0   Ectopic  0   Multiple  0   Live Births  0           Social History: Social History   Socioeconomic History  . Marital status: Single    Spouse name: Not on file  . Number of children: Not on file  . Years of education: Not on file  . Highest education level: Not on file  Occupational History  . Not on file  Social Needs  . Financial resource strain: Not hard at all  . Food insecurity:    Worry: Never true    Inability: Never true  . Transportation needs:    Medical: No    Non-medical: Not on file  Tobacco Use  . Smoking status: Never Smoker  . Smokeless tobacco: Never Used  Substance and Sexual Activity  . Alcohol use: No  . Drug use: No  . Sexual activity: Yes    Birth control/protection: None  Lifestyle  . Physical activity:    Days per week: Not on file    Minutes per session: Not on file  . Stress: Only a little  Relationships  . Social connections:    Talks on phone: Not on file    Gets together: Not on file    Attends religious service: Not on file    Active member of club or organization: Not on file    Attends meetings of clubs or organizations: Not on file     Relationship status: Not on file  Other Topics Concern  . Not on file  Social History Narrative  . Not on file    Family History: Family History  Problem Relation Age of Onset  . Diabetes Father     Allergies: No Known Allergies  Medications Prior to Admission  Medication Sig Dispense Refill Last Dose  . Prenatal Vit-Iron Carbonyl-FA (PRENATAL PLUS IRON) 29-1 MG TABS Take daily 30 tablet 12 Taking     Review of Systems  All systems reviewed and negative except as stated in HPI  Physical Exam Blood pressure 125/73, pulse 73, temperature 97.6 F (36.4 C), temperature source Oral, resp. rate 18, height 5\' 3"  (1.6 m), weight 67.6 kg, last menstrual period 12/05/2017. General appearance: alert, oriented, NAD Lungs: normal respiratory effort Heart: regular rate Abdomen: soft, non-tender; gravid, FH appropriate for GA Extremities: No calf swelling or tenderness Presentation: cephalic on 4/03 US Fetal monitoring: FHR 120 baseline, + acels/-decel Uterine activity: Contractions every 2-2.5 min Dilation: 4.5 Effacement (%): 90 Station: 0 Exam by:: Sheryle Hail, RN  Prenatal  labs: ABO, Rh: O/Positive/-- (04/29 1240) Antibody: Negative (08/14 0919) Rubella: 3.37 (04/29 1240) RPR: Non Reactive (08/14 0919)  HBsAg: Negative (04/29 1240)  HIV: Non Reactive (08/14 0919)  GC/Chlamydia: -/- on 10/2 GBS:   neg 2-hr GTT: early 26-28wks 76/164/89 Genetic screening:  NT/IT neg Anatomy US: normal female anatomy  Prenatal Transfer Tool  Maternal Diabetes: No Genetic Screening: Normal Maternal Ultrasounds/Referrals: Normal Fetal Ultrasounds or other Referrals:  None Maternal Substance Abuse:  No Significant Maternal Medications:  None Significant Maternal Lab Results: None  Results for orders placed or performed during the hospital encounter of 09/06/18 (from the past 24 hour(s))  POCT fern test   Collection Time: 09/06/18  8:15 PM  Result Value Ref Range   POCT Fern Test  Positive = ruptured amniotic membanes   CBC   Collection Time: 09/06/18  8:35 PM  Result Value Ref Range   WBC 10.6 (H) 4.0 - 10.5 K/uL   RBC 4.19 3.87 - 5.11 MIL/uL   Hemoglobin 13.3 12.0 - 15.0 g/dL   HCT 38.1 36.0 - 46.0 %   MCV 90.9 80.0 - 100.0 fL   MCH 31.7 26.0 - 34.0 pg   MCHC 34.9 30.0 - 36.0 g/dL   RDW 13.1 11.5 - 15.5 %   Platelets 134 (L) 150 - 400 K/uL   nRBC 0.0 0.0 - 0.2 %    Patient Active Problem List   Diagnosis Date Noted  . Post term pregnancy 09/06/2018  . Asymptomatic bacteriuria during pregnancy in first trimester 03/07/2018  . Supervision of normal first pregnancy 03/03/2018    Assessment: Lindsay Blackwell is a 17 y.o. G1P0000 at [redacted]w[redacted]d here for PROM around 745pm 11/2.   #Labor: Progressing well #Pain: No epidural, IV fentanyl #FWB:  1746 at 31 weeks #ID:  N/a #MOF: Breast #MOC:[place] #Circ:  n/a  Saddie Benders 09/06/2018, 9:13 PM  OB FELLOW HISTORY AND PHYSICAL ATTESTATION  I have seen and examined this patient; I agree with above documentation in the student's note. Ms. Blowers has had an uncomplicated pregnancy and presents in spontaneous labor. Will use expectant management. Fetal wellbeing Cat I strip. Expect SVD.   Aura Camps, MD OB Fellow  09/07/2018, 8:18 AM

## 2018-09-06 NOTE — MAU Note (Signed)
SROM 69mins ago. Clear fld. Ctxs. 3cm last sve. Pants very wet with fld

## 2018-09-06 NOTE — Anesthesia Pain Management Evaluation Note (Signed)
  CRNA Pain Management Visit Note  Patient: Lindsay Blackwell, 17 y.o., female  "Hello I am a member of the anesthesia team at [facility]. We have an anesthesia team available at all times to provide care throughout the hospital, including epidural management and anesthesia for C-section. I don't know your plan for the delivery whether it a natural birth, water birth, IV sedation, nitrous supplementation, doula or epidural, but we want to meet your pain goals."   1.Was your pain managed to your expectations on prior hospitalizations?   No prior hospitalizations  2.What is your expectation for pain management during this hospitalization?     Labor support without medications, Epidural, IV pain meds and Nitrous Oxide  3.How can we help you reach that goal? Pt Already hurting badly. Was open to discussion about pain control. All questions answered.  Record the patient's initial score and the patient's pain goal.   Pain: 9  Pain Goal: 5 [facility] wants you to be able to say your pain was always managed very well.  Sibyl Mikula 09/06/2018

## 2018-09-07 ENCOUNTER — Encounter (HOSPITAL_COMMUNITY): Payer: Self-pay

## 2018-09-07 LAB — CBC
HCT: 32.9 % — ABNORMAL LOW (ref 36.0–46.0)
HEMOGLOBIN: 11.4 g/dL — AB (ref 12.0–15.0)
MCH: 31.4 pg (ref 26.0–34.0)
MCHC: 34.7 g/dL (ref 30.0–36.0)
MCV: 90.6 fL (ref 80.0–100.0)
Platelets: 138 10*3/uL — ABNORMAL LOW (ref 150–400)
RBC: 3.63 MIL/uL — AB (ref 3.87–5.11)
RDW: 13 % (ref 11.5–15.5)
WBC: 13.9 10*3/uL — AB (ref 4.0–10.5)
nRBC: 0 % (ref 0.0–0.2)

## 2018-09-07 LAB — RPR: RPR Ser Ql: NONREACTIVE

## 2018-09-07 LAB — ABO/RH: ABO/RH(D): O POS

## 2018-09-07 MED ORDER — OXYCODONE-ACETAMINOPHEN 5-325 MG PO TABS: 2.0000 | ORAL_TABLET | ORAL | Status: DC | PRN

## 2018-09-07 MED ORDER — LIDOCAINE HCL (PF) 1 % IJ SOLN
30.0000 mL | INTRAMUSCULAR | Status: DC | PRN
  Filled 2018-09-07: qty 30

## 2018-09-07 MED ORDER — BENZOCAINE-MENTHOL 20-0.5 % EX AERO
1.0000 "application " | INHALATION_SPRAY | CUTANEOUS | Status: DC | PRN
  Administered 2018-09-07: 1 via TOPICAL
  Filled 2018-09-07: qty 56

## 2018-09-07 MED ORDER — COCONUT OIL OIL: 1.0000 "application " | TOPICAL_OIL | Status: DC | PRN

## 2018-09-07 MED ORDER — OXYCODONE-ACETAMINOPHEN 5-325 MG PO TABS
1.0000 | ORAL_TABLET | ORAL | Status: DC | PRN
  Administered 2018-09-07: 1 via ORAL
  Filled 2018-09-07: qty 1

## 2018-09-07 MED ORDER — SIMETHICONE 80 MG PO CHEW: 80.0000 mg | CHEWABLE_TABLET | ORAL | Status: DC | PRN

## 2018-09-07 MED ORDER — DIBUCAINE 1 % RE OINT: 1.0000 "application " | TOPICAL_OINTMENT | RECTAL | Status: DC | PRN

## 2018-09-07 MED ORDER — DIPHENHYDRAMINE HCL 25 MG PO CAPS: 25.0000 mg | ORAL_CAPSULE | Freq: Four times a day (QID) | ORAL | Status: DC | PRN

## 2018-09-07 MED ORDER — OXYTOCIN BOLUS FROM INFUSION: 500.0000 mL | Freq: Once | INTRAVENOUS | Status: DC

## 2018-09-07 MED ORDER — PRENATAL MULTIVITAMIN CH
1.0000 | ORAL_TABLET | Freq: Every day | ORAL | Status: DC
  Administered 2018-09-07 – 2018-09-08 (×2): 1 via ORAL
  Filled 2018-09-07 (×2): qty 1

## 2018-09-07 MED ORDER — OXYTOCIN 40 UNITS IN LACTATED RINGERS INFUSION - SIMPLE MED: 2.5000 [IU]/h | INTRAVENOUS | Status: DC

## 2018-09-07 MED ORDER — WITCH HAZEL-GLYCERIN EX PADS: 1.0000 "application " | MEDICATED_PAD | CUTANEOUS | Status: DC | PRN

## 2018-09-07 MED ORDER — ONDANSETRON HCL 4 MG/2ML IJ SOLN: 4.0000 mg | INTRAMUSCULAR | Status: DC | PRN

## 2018-09-07 MED ORDER — ACETAMINOPHEN 325 MG PO TABS: 650.0000 mg | ORAL_TABLET | ORAL | Status: DC | PRN

## 2018-09-07 MED ORDER — ONDANSETRON HCL 4 MG/2ML IJ SOLN: 4.0000 mg | Freq: Four times a day (QID) | INTRAMUSCULAR | Status: DC | PRN

## 2018-09-07 MED ORDER — SENNOSIDES-DOCUSATE SODIUM 8.6-50 MG PO TABS
2.0000 | ORAL_TABLET | ORAL | Status: DC
  Administered 2018-09-07 – 2018-09-08 (×2): 2 via ORAL
  Filled 2018-09-07 (×2): qty 2

## 2018-09-07 MED ORDER — LACTATED RINGERS IV SOLN: 500.0000 mL | INTRAVENOUS | Status: DC | PRN

## 2018-09-07 MED ORDER — LACTATED RINGERS IV SOLN: INTRAVENOUS | Status: DC

## 2018-09-07 MED ORDER — IBUPROFEN 600 MG PO TABS
600.0000 mg | ORAL_TABLET | Freq: Four times a day (QID) | ORAL | Status: DC
  Administered 2018-09-07 – 2018-09-08 (×6): 600 mg via ORAL
  Filled 2018-09-07 (×6): qty 1

## 2018-09-07 MED ORDER — ONDANSETRON HCL 4 MG PO TABS: 4.0000 mg | ORAL_TABLET | ORAL | Status: DC | PRN

## 2018-09-07 MED ORDER — FLEET ENEMA 7-19 GM/118ML RE ENEM: 1.0000 | ENEMA | Freq: Every day | RECTAL | Status: DC | PRN

## 2018-09-07 MED ORDER — ACETAMINOPHEN 325 MG PO TABS
650.0000 mg | ORAL_TABLET | ORAL | Status: DC | PRN
  Administered 2018-09-07: 650 mg via ORAL

## 2018-09-07 MED ORDER — TETANUS-DIPHTH-ACELL PERTUSSIS 5-2.5-18.5 LF-MCG/0.5 IM SUSP: 0.5000 mL | Freq: Once | INTRAMUSCULAR | Status: DC

## 2018-09-07 MED ORDER — ZOLPIDEM TARTRATE 5 MG PO TABS: 5.0000 mg | ORAL_TABLET | Freq: Every evening | ORAL | Status: DC | PRN

## 2018-09-07 MED ORDER — SOD CITRATE-CITRIC ACID 500-334 MG/5ML PO SOLN: 30.0000 mL | ORAL | Status: DC | PRN

## 2018-09-07 NOTE — Lactation Note (Addendum)
This note was copied from a baby's chart. Lactation Consultation Note  Patient Name: Lindsay Blackwell [id number] Date: 09/07/2018 Reason for consult: Initial assessment;1st time breastfeeding;Term P1, 4 hour female infant. Per mom, she  did not attend BF classes in pregnancy. LC changed soiled diaper while in patient's room. Infant had total of 2 soiled diapers since delivery.  Mom doesn't have breast pump at home. Receives [facility] in [place]. Taught hand express and mom expressed 3 ml of colostrum that was spoon fed to infant. [place] notice mom has inverted nipples slight on nipple tip, mom doesn't  need nipple shield or shells.  Infant latched on right breast in cross cradle hold with audible swallows observed by LC and infant BF for 15 minutes. Mom will BF according hunger cues, 8 to 12 times within 24 hours including nights. LC discussed I & O. Reviewed Baby & Me book's Breastfeeding Basics.  Mom made aware of O/P services, breastfeeding support groups, community resources, and our phone # for post-discharge questions.  Maternal Data Formula Feeding for Exclusion: No Has patient been taught Hand Expression?: Yes(Mom expressed 62ml of colostrum that was given to infant on spoon,)  Feeding Feeding Type: Breast Fed  LATCH Score Latch: Grasps breast easily, tongue down, lips flanged, rhythmical sucking.  Audible Swallowing: Spontaneous and intermittent  Type of Nipple: Inverted(only nipple tip)  Comfort (Breast/Nipple): Soft / non-tender  Hold (Positioning): Assistance needed to correctly position infant at breast and maintain latch.  LATCH Score: 7  Interventions Interventions: Breast feeding basics reviewed;Assisted with latch;Hand express;Support pillows;Adjust position;Breast compression  Lactation Tools Discussed/Used WIC Program: Yes   Consult Status Consult Status: Follow-up Date: 09/07/18 Follow-up type: In-patient    Vicente Serene 09/07/2018, 3:06  AM

## 2018-09-07 NOTE — Lactation Note (Addendum)
This note was copied from a baby's chart. Lactation Consultation Note  Patient Name: Lindsay Blackwell [id number] Date: 09/07/2018 Reason for consult: Follow-up assessment;Primapara;1st time breastfeeding;Term  Baby is 83 hours old  LC reviewed and updated the doc flow sheets.  As LC entered the room , baby starting to wake up , LC checked diaper , large wet and  Black stool changed and LC assisted mom to latch on the right breast / football.  [place] 1st reviewed hand expressing and colostrum easily was flowing. Per mom has been leaking since 20 weeks pregnancy. After several attempts  and assistance baby latched  With depth, multiple swallows noted, increased with breast compressions, and nipple well well rounded when baby released. Per mom comfortable with latch.  LC stressed the importance of STS feedings until the baby can stay awake for a feeding, gaining back to birth weight, and gaining steadily. Discussed nutritive vs non- nutritive feeding  Patterns and the importance of watching baby for hanging out latched.  Mom aware of what swallows sound like and the importance of breast compressions.  Baby released at 15 mins ,content and alert.  20 mins later rooting,LC assisted to latch on the left breast / cross cradle/ and baby latched \ Easily with depth and multiple swallows. Per mom comfortable, baby still feeding at 8 mins.  MBURN Laural Benes aware to document total feeding time.  LC noted semi short shaft nipples , areolas compressible and feel shells between feedings and pre-pumping prior to every feeding will ensure the baby can get a consistent deep latch.  LC instructed them on both, and noted the #24 F was good fit. Mom will have dad bring in a bra for shells.  Mom seemed excited the baby fed so well and there were swallows.  Mother informed of post-discharge support and given phone number to the lactation department, including services for phone call assistance; out-patient  appointments; and breastfeeding support group. List of other breastfeeding resources in the community given in the handout. Encouraged mother to call for problems or concerns related to breastfeeding.  [place] set mom with DEBP this am and per mom has pumped x 1 and got drops.  LC reassured mom that is normal, can be a slow process.  [place] praised mom for how well she is doing,   Maternal Data Has patient been taught Hand Expression?: Yes Does the patient have breastfeeding experience prior to this delivery?: No  Feeding Feeding Type: Breast Fed  LATCH Score Latch: Grasps breast easily, tongue down, lips flanged, rhythmical sucking.  Audible Swallowing: Spontaneous and intermittent  Type of Nipple: Everted at rest and after stimulation  Comfort (Breast/Nipple): Soft / non-tender  Hold (Positioning): Assistance needed to correctly position infant at breast and maintain latch.  LATCH Score: 9  Interventions Interventions: Breast feeding basics reviewed;Assisted with latch;Skin to skin;Breast massage;Hand express;Pre-pump if needed;Adjust position;Breast compression;Support pillows;Position options  Lactation Tools Discussed/Used Tools: Shells;Pump Shell Type: Inverted Breast pump type: Manual;Double-Electric Breast Pump WIC Program: Yes Pump Review: Setup, frequency, and cleaning;Milk Storage Initiated by:: MAI  Date initiated:: 09/07/18   Consult Status Consult Status: Follow-up Date: 09/08/18 Follow-up type: In-patient    [place] 09/07/2018, 3:49 PM

## 2018-09-07 NOTE — Progress Notes (Signed)
RN entered the room at 0135 for 1hr fundal check and pt complained of "feeling pressure" like she "needs to push". After assessment (WDL), RN ambulated with pt to the bathroom. Pt still complained of feeling pressure and then stated she felt like she "couldn't breathe" and was "going to pass out". Pt became pale and started breathing heavily then leaned backwards. RN activated ammonia inhalant and encouraged deep breathing while calling for assistance. Pt leaned against RN while sitting on toilet until NT arrived with stedy.  RN and NT used stedy to get pt back to bed placed pt in Trendelenberg position and checked BP at 0145 (111/64).  Gave pt sips of water and applied a cool compress to her forehead. Pt quickly reported she was "feeling much better". RN checked BP 3 more times and vitals were stable. Repositioned to semi-fowlers. RN encouraged pt to eat and call out for assistance to bathroom within the next hour or so. Pt is currently stable with no complaints, RN will continue to monitor.   Gearldine Bienenstock, RN 09/07/2018 3:55 AM

## 2018-09-07 NOTE — Progress Notes (Signed)
Post Partum Day 1, SVD 09/06/18 @ 2216 Subjective: no complaints, up ad lib, voiding, tolerating PO and + flatus  Objective: Blood pressure 113/75, pulse 77, temperature 98.4 F (36.9 C), temperature source Oral, resp. rate 18, height 5\' 3"  (1.6 m), weight 67.6 kg, last menstrual period 12/05/2017, unknown if currently breastfeeding.  Physical Exam:  General: alert, cooperative, appears stated age and no distress Lochia: appropriate Uterine Fundus: firm Incision: N/A DVT Evaluation: No evidence of DVT seen on physical exam.  Recent Labs    09/06/18 2035 09/07/18 0553  HGB 13.3 11.4*  HCT 38.1 32.9*    Assessment/Plan: Patient declined to discuss contraception when I rounded on her. She is not a [employer] resident, remains undecided about contraception Plan for discharge tomorrow   LOS: 1 day   Darlina Rumpf, CNM 09/07/2018, 11:43 AM

## 2018-09-08 ENCOUNTER — Telehealth: Payer: Self-pay | Admitting: *Deleted

## 2018-09-08 MED ORDER — IBUPROFEN 600 MG PO TABS: 600.0000 mg | ORAL_TABLET | Freq: Four times a day (QID) | ORAL | 0 refills | Status: DC

## 2018-09-08 NOTE — Telephone Encounter (Signed)
Tried to call pt to set up pp appointment but VM not set up.  09-08-18  AS

## 2018-09-08 NOTE — Discharge Summary (Signed)
Postpartum Discharge Summary     Patient Name: Lindsay Blackwell DOB: [birth year] MRN: [id number]  Date of admission: 09/06/2018 Delivering Provider: Mena Goes E   Date of discharge: 09/08/2018  Admitting diagnosis: water broke Intrauterine pregnancy: [redacted]w[redacted]d     Secondary diagnosis:  Active Problems:   Normal labor   PROM (premature rupture of membranes)  Additional problems: asymptomatic bacteruria      Discharge diagnosis: Term Pregnancy Delivered                                                                                                Post partum procedures:None  Augmentation: None  Complications: None  Hospital course:  Onset of Labor With Vaginal Delivery     17 y.o. yo G1P1001 at [redacted]w[redacted]d was admitted in Active Labor on 09/06/2018. Patient had an uncomplicated labor course as follows:  Membrane Rupture Time/Date: 7:30 PM ,09/06/2018   Intrapartum Procedures: Episiotomy: None [1]                                         Lacerations:  2nd degree [3]  Patient had a delivery of a Viable infant. 09/06/2018  Information for the patient's newborn:  Aluel, Schwarz [[id number]]  Delivery Method: Vaginal, Spontaneous(Filed from Delivery Summary)    Pateint had an uncomplicated postpartum course.  She is ambulating, tolerating a regular diet, passing flatus, and urinating well. Patient is discharged home in stable condition on 09/08/18.   Magnesium Sulfate recieved: No BMZ received: No  Physical exam  Vitals:   09/07/18 0901 09/07/18 1247 09/07/18 2015 09/08/18 0525  BP: 113/75 106/65 121/85 102/67  Pulse: 77 78 76 69  Resp: 18 18 18 16   Temp: 98.4 F (36.9 C) 98.3 F (36.8 C) 97.7 F (36.5 C)   TempSrc: Oral Oral Axillary   SpO2:   99% 99%  Weight:      Height:       General: alert, cooperative and no distress Lochia: appropriate Uterine Fundus: firm Incision: N/A DVT Evaluation: No evidence of DVT seen on physical exam. Labs: Lab Results   Component Value Date   WBC 13.9 (H) 09/07/2018   HGB 11.4 (L) 09/07/2018   HCT 32.9 (L) 09/07/2018   MCV 90.6 09/07/2018   PLT 138 (L) 09/07/2018   No flowsheet data found.  Discharge instruction: per After Visit Summary and "Baby and Me Booklet".  After visit meds:  Allergies as of 09/08/2018   No Known Allergies     Medication List    TAKE these medications   ibuprofen 600 MG tablet Commonly known as:  ADVIL,MOTRIN Take 1 tablet (600 mg total) by mouth every 6 (six) hours.   PRENATAL PLUS IRON 29-1 MG Tabs Take daily What changed:    how much to take  how to take this  when to take this       Diet: routine diet  Activity: Advance as tolerated. Pelvic rest for 6 weeks.   Outpatient follow up:4 weeks Follow  up Appt: Future Appointments  Date Time Provider [place]  09/11/2018  9:00 AM Jonnie Kind, MD [facility]   Follow up Visit:   Please schedule this patient for Postpartum visit in: 4 weeks with the following provider: Any provider  For C/S patients schedule nurse incision check in weeks 2 weeks: no  Low risk pregnancy complicated by: None  Delivery mode: SVD  Anticipated Birth Control: IUD  PP Procedures needed: None  Schedule Integrated BH visit: no    Newborn Data: Live born female  Birth Weight: 6 lb 12.3 oz (3070 g) APGAR: 9, 9  Newborn Delivery   Birth date/time:  09/06/2018 22:16:00 Delivery type:  Vaginal, Spontaneous     Baby Feeding: Breast Disposition:home with mother   09/08/2018 Aura Camps, MD

## 2018-09-08 NOTE — Lactation Note (Signed)
This note was copied from a baby's chart. Lactation Consultation Note  Patient Name: Lindsay Blackwell [id number] Date: 09/08/2018 Reason for consult: Follow-up assessment   P1, Baby 27 hours old.  17 years old. Mother had some difficulty latching so she started formula feeding with bottle. Now baby is showing signs of nipple confusion. [name] RN helping mother latch baby on L side upon entering. LC demonstrated how to compress breast to keep baby active. Mother has good flow of colostrum from R side. Assisted w/ latching on R breast and baby sustained for more than 10 min. Encouraged breastfeeding before offering formula. Mother is concerned because she did not receive a large volume the last time she pumped. Provided education how breastmilk comes to volume. Mom encouraged to feed baby 8-12 times/24 hours and with feeding cues.   Reiterated prepumping to help with latching. Reviewed engorgement care and monitoring voids/stools. Wake baby for feedings if needed.     Maternal Data Has patient been taught Hand Expression?: Yes  Feeding Feeding Type: Breast Fed  LATCH Score Latch: Repeated attempts needed to sustain latch, nipple held in mouth throughout feeding, stimulation needed to elicit sucking reflex.  Audible Swallowing: Spontaneous and intermittent  Type of Nipple: Everted at rest and after stimulation  Comfort (Breast/Nipple): Soft / non-tender  Hold (Positioning): Assistance needed to correctly position infant at breast and maintain latch.  LATCH Score: 8  Interventions Interventions: Breast feeding basics reviewed;Assisted with latch;Breast compression;Hand express;Hand pump  Lactation Tools Discussed/Used     Consult Status Consult Status: Complete Date: 09/08/18    [name] [facility] 09/08/2018, 10:41 AM

## 2018-09-08 NOTE — Progress Notes (Signed)
Parent request formula to supplement breast feeding due to insufficient milk supply.Parents have been informed of small tummy size of newborn, taught hand expression and understand the possible consequences of formula to the health of the infant. The possible consequences shared with patient include 1) Loss of confidence in breastfeeding 2) Engorgement 3) Allergic sensitization of baby(asthma/allergies) and 4) decreased milk supply for mother.After discussion of the above the mother decided to  supplement with formula.  The tool used to give formula supplement given.

## 2018-09-08 NOTE — Clinical Social Work Maternal (Signed)
CLINICAL SOCIAL WORK MATERNAL/CHILD NOTE  Patient Details  Name: Lindsay Blackwell MRN: [id number] Date of Birth: 2001-06-26  Date:  09/08/2018  Clinical Social Worker Initiating Note:  Abundio Miu, [place] Date/Time: Initiated:  09/08/18/1505     Child's Name:  Lindsay Blackwell   Biological Parents:  Mother, Father(Father - Madelyn Flavors 04/24/1995)   Need for Interpreter:  None   Reason for Referral:  Behavioral Health Concerns(Edinburgh Score - 11)   Address:  [place] [address]    Phone number:  [phone] (home)     Additional phone number:   Household Members/Support Persons (HM/SP):   Household Member/Support Person 1, Household Member/Support Person 3   HM/SP Name Relationship DOB or Age  HM/SP -[place] Father of baby 04/24/1995  HM/SP -2        HM/SP -3   Grandfather of baby    HM/SP -4        HM/SP -5        HM/SP -6        HM/SP -7        HM/SP -8          Natural Supports (not living in the home):  Parent(Mother)   Professional Supports: None   Employment: Animator   Type of Work: Passenger transport manager    Education:  [employer] school graduate   Homebound arranged:    Museum/gallery curator Resources:  Medicaid   Other Resources:      Cultural/Religious Considerations Which May Impact Care:    Strengths:  Ability to meet basic needs , Pediatrician chosen   Psychotropic Medications:         Pediatrician:    [employer]  Pediatrician List:   [place]      Pediatrician Fax Number:    Risk Factors/Current Problems:  None   Cognitive State:  Able to Concentrate , Alert    Mood/Affect:  Calm , Happy    CSW Assessment: CSW met with MOB at bedside to discuss edinburgh score 11. MOB presented calm and appeared attached and bonded with baby as evidenced by skin to skin. MOB denied any  prior mental health hx and reported that she was nervous because this was her first baby. CSW validated MOB feelings. MOB reported that she resides with FOB, FOB dad and that she has additional supports to assist with baby (MOB mother, FOB mother). MOB reported that she has everything that baby would need except for a mattress for a crib that she will be getting soon. MOB inquired about co-sleeping with baby until she is able to get the mattress for baby's crib. CSW informed MOB that co-sleeping is not appropriate and that baby needs a safe place to sleep alone. CSW informed MOB about baby box, MOB requested baby box so baby could have a safe place to sleep. CSW agreed to notify MOB's bedside RN to assist patient with obtaining baby box before discharge. CSW assessed for safety, MOB denied SI, HI and domestic violence.   CSW provided education regarding the baby blues period vs. perinatal mood disorders, discussed treatment and gave resources for mental health follow up if concerns arise.  CSW recommends self-evaluation during the postpartum time period using the New Mom Checklist from Postpartum Progress and encouraged MOB to contact a medical professional if symptoms are noted at any time.  CSW provided review of Sudden Infant Death Syndrome (SIDS) precautions.    CSW notified MOB's bedside RN that MOB needs baby box. Bedside RN agreed to assist MOB with getting a baby box prior to discharge.   CSW identifies no further need for intervention and no barriers to discharge at this time.  CSW Plan/Description:  No Further Intervention Required/No Barriers to Discharge    Burnis Medin, LCSW 09/08/2018, 3:12 PM

## 2018-09-11 ENCOUNTER — Encounter: Payer: BLUE CROSS/BLUE SHIELD | Admitting: Obstetrics and Gynecology

## 2018-09-18 ENCOUNTER — Inpatient Hospital Stay (HOSPITAL_COMMUNITY): Admission: RE | Admit: 2018-09-18 | Payer: Medicaid Other | Source: Ambulatory Visit

## 2018-10-14 ENCOUNTER — Encounter: Payer: Self-pay | Admitting: Women's Health

## 2018-10-14 ENCOUNTER — Ambulatory Visit (INDEPENDENT_AMBULATORY_CARE_PROVIDER_SITE_OTHER): Payer: Medicaid Other | Admitting: Women's Health

## 2018-10-14 DIAGNOSIS — Z3202 Encounter for pregnancy test, result negative: Secondary | ICD-10-CM | POA: Diagnosis not present

## 2018-10-14 DIAGNOSIS — Z3009 Encounter for other general counseling and advice on contraception: Secondary | ICD-10-CM

## 2018-10-14 LAB — POCT URINE PREGNANCY: Preg Test, Ur: NEGATIVE

## 2018-10-14 NOTE — Patient Instructions (Addendum)
NO SEX AFTER 10/22/18 USE CONDOMS WHEN YOU HAVE SEX (if you do before 12/18)  Constipation  Drink plenty of fluid, preferably water, throughout the day  Eat foods high in fiber such as fruits, vegetables, and grains  Exercise, such as walking, is a good way to keep your bowels regular  Drink warm fluids, especially warm prune juice, or decaf coffee  Eat a 1/2 cup of real oatmeal (not instant), 1/2 cup applesauce, and 1/2-1 cup warm prune juice every day  If needed, you may take Colace (docusate sodium) stool softener once or twice a day to help keep the stool soft. If you are pregnant, wait until you are out of your first trimester (12-14 weeks of pregnancy)  If you still are having problems with constipation, you may take Miralax once daily as needed to help keep your bowels regular.  If you are pregnant, wait until you are out of your first trimester (12-14 weeks of pregnancy)    Tips to Help You Sleep Better:   Get into a bedtime routine, try to do the same thing every night before going to bed to try to help your body wind down  Warm baths  Avoid caffeine for at least 3 hours before going to sleep   Keep your room at a slightly cooler temperature, can try running a fan  Turn off TV, lights, phone, electronics  Lots of pillows if needed to help you get comfortable  Lavender scented items can help you sleep. You can place lavender essential oil on a cotton ball and place under your pillowcase, or place in a diffuser. [employer] has a lavender scented sleep line (plug-ins, sprays, etc). Look in the pillow aisle for lavender scented pillows.   If none of the above things help, you can try 1/2 to 1 tablet of benadryl, unisom, or tylenol pm. Do not take this every night, only when you really need it.

## 2018-10-14 NOTE — Progress Notes (Signed)
   POSTPARTUM VISIT Patient name: Lindsay Blackwell MRN [id number]  Date of birth: 08/31/2001 Chief Complaint:   postpartum visit (interested in [place]; has a mucus discharge)  History of Present Illness:   Lindsay Blackwell is a 17 y.o. G1P1014female being seen today for a postpartum visit. She is 5 weeks postpartum following a spontaneous vaginal delivery at 39.2 gestational weeks. Anesthesia: local and IV sedation. Laceration: 2nd degree. I have fully reviewed the prenatal and intrapartum course. Pregnancy uncomplicated. Postpartum course has been uncomplicated. Having some trouble sleeping. Bleeding no bleeding. Bowel function is constipation. Bladder function is normal.  Patient is sexually active. Last sexual activity: yesterday.  Contraception method is none and wants nexplanon.  [place] Postpartum Depression Screening: negative. Score 5.   Last pap <21yo.  Results were n/a .  No LMP recorded.  Baby's course has been uncomplicated. Baby is feeding by breast & bottle.  Review of Systems:   Pertinent items are noted in HPI Denies Abnormal vaginal discharge w/ itching/odor/irritation, headaches, visual changes, shortness of breath, chest pain, abdominal pain, severe nausea/vomiting, or problems with urination or bowel movements. Pertinent History Reviewed:  Reviewed past medical,surgical, obstetrical and family history.  Reviewed problem list, medications and allergies. OB History  Gravida Para Term Preterm AB Living  1 1 1  0 0 1  SAB TAB Ectopic Multiple Live Births  0 0 0 0 1    # Outcome Date GA Lbr Len/2nd Weight Sex Delivery Anes PTL Lv  1 Term 09/06/18 [redacted]w[redacted]d 02:22 / 00:24 6 lb 12.3 oz (3.07 kg) F Vag-Spont None  LIV   Physical Assessment:   Vitals:   10/14/18 0919  BP: 112/77  Pulse: 89  Weight: 127 lb 8 oz (57.8 kg)  Height: 5\' 3"  (1.6 m)  Body mass index is 22.59 kg/m.       Physical Examination:   General appearance: alert, well appearing, and in no  distress  Mental status: alert, oriented to person, place, and time  Skin: warm & dry   Cardiovascular: normal heart rate noted   Respiratory: normal respiratory effort, no distress   Breasts: deferred, no complaints   Abdomen: soft, non-tender   Pelvic: VULVA: normal appearing vulva with no masses, tenderness or lesions, UTERUS: uterus is normal size, shape, consistency and nontender  Rectal: no hemorrhoids  Extremities: no edema       Results for orders placed or performed in visit on 10/14/18 (from the past 24 hour(s))  POCT urine pregnancy   Collection Time: 10/14/18  9:22 AM  Result Value Ref Range   Preg Test, Ur Negative Negative    Assessment & Plan:  1) Postpartum exam 2) 5 wks s/p SVB 3) Breast &bottlefeeding 4) Depression screening 5) Contraception counseling, pt prefers nexplanon, order today, to come 1/2 am for hcg/pm insertion, no sex after 12/18. Use condom if has sex prior to 12/18  6) Constipation> gave printed prevention/relief measures  7) Trouble sleeping> gave printed prevention/relief measures   Meds: No orders of the defined types were placed in this encounter.   Follow-up: Return for 1/2 for am bhcg/pm nexplanon insertion, order today please.   Orders Placed This Encounter  Procedures  . POCT urine pregnancy    [place], [facility] 10/14/2018 9:52 AM

## 2018-11-06 ENCOUNTER — Encounter: Payer: Medicaid Other | Admitting: Women's Health

## 2018-11-06 ENCOUNTER — Other Ambulatory Visit: Payer: Medicaid Other

## 2018-11-24 ENCOUNTER — Other Ambulatory Visit: Payer: Medicaid Other

## 2018-11-24 ENCOUNTER — Encounter: Payer: Medicaid Other | Admitting: Women's Health

## 2019-11-06 NOTE — L&D Delivery Note (Signed)
Delivery Note Arrived in MAU in active labor with urge to push.  We had just gotten her into the bed when she started pushing and delivered in one contractions  At 6:00 AM a viable and healthy female was delivered via Vaginal, Spontaneous (Presentation: OA ).  APGAR: 8, 9; weight  .   Placenta status: Spontaneous, Intact.  Cord: 3 vessels with the following complications: None.    Anesthesia: None Episiotomy: None Lacerations: 1st degree;Vaginal Suture Repair: 3.0 Monocryl Est. Blood Loss (mL):    Mom to postpartum.  Baby to Couplet care / Skin to Skin.  Hansel Feinstein 09/01/2020, 6:42 AM

## 2020-01-18 ENCOUNTER — Other Ambulatory Visit: Payer: Self-pay

## 2020-01-18 ENCOUNTER — Ambulatory Visit (INDEPENDENT_AMBULATORY_CARE_PROVIDER_SITE_OTHER): Payer: Medicaid Other

## 2020-01-18 VITALS — BP 110/70 | HR 76 | Ht 63.0 in | Wt 131.0 lb

## 2020-01-18 DIAGNOSIS — Z3201 Encounter for pregnancy test, result positive: Secondary | ICD-10-CM | POA: Diagnosis not present

## 2020-01-18 LAB — POCT URINE PREGNANCY: Preg Test, Ur: POSITIVE — AB

## 2020-01-18 MED ORDER — PRENATAL PLUS IRON 29-1 MG PO TABS: 1.0000 | ORAL_TABLET | Freq: Every day | ORAL | 12 refills | Status: DC

## 2020-01-18 MED ORDER — PROMETHAZINE HCL 25 MG PO TABS: 25.0000 mg | ORAL_TABLET | Freq: Four times a day (QID) | ORAL | 1 refills | Status: DC | PRN

## 2020-01-18 NOTE — Progress Notes (Signed)
   NURSE VISIT- PREGNANCY CONFIRMATION   SUBJECTIVE:  Lindsay Blackwell is a 19 y.o. G6P1001 female  Patient's last menstrual period was 11/27/2019. Here for pregnancy confirmation.  Home pregnancy test postive. Concern passes blood clots in January then had period 11-27-19. Having some nausea.  OBJECTIVE:  BP 110/70 (BP Location: Right Arm, Patient Position: Sitting, Cuff Size: Normal)   Pulse 76   Ht 5\' 3"  (1.6 m)   Wt 131 lb (59.4 kg)   LMP 11/27/2019   BMI 23.21 kg/m   Appears well, in no apparent distress OB History  Gravida Para Term Preterm AB Living  2 1 1  0 0 1  SAB TAB Ectopic Multiple Live Births  0 0 0 0 1    # Outcome Date GA Lbr Len/2nd Weight Sex Delivery Anes PTL Lv  2 Current           1 Term 09/06/18 [redacted]w[redacted]d 02:22 / 00:24 6 lb 12.3 oz (3.07 kg) F Vag-Spont None  LIV    Results for orders placed or performed in visit on 01/18/20 (from the past 24 hour(s))  POCT urine pregnancy   Collection Time: 01/18/20  9:58 AM  Result Value Ref Range   Preg Test, Ur Positive (A) Negative    ASSESSMENT:   PLAN: Schedule for dating ultrasound in 3-4 weeks Prenatal vitamins not taking  Nausea medicines needed   OB packet given yes Will route note jennifer griffin Ysidro Evert [facility]  01/18/2020 9:58 AM

## 2020-01-18 NOTE — Addendum Note (Signed)
Addended by: Derrek Monaco A on: 01/18/2020 10:38 AM   Modules accepted: Orders

## 2020-01-18 NOTE — Progress Notes (Addendum)
Chart reviewed for nurse visit. Agree with plan of care,except get [place] in about 1-2 weeks, not 4 weeks. Will rx PNV and phenergan Estill Dooms, NP 01/18/2020 10:34 AM

## 2020-01-27 ENCOUNTER — Other Ambulatory Visit: Payer: Self-pay | Admitting: Obstetrics and Gynecology

## 2020-01-27 DIAGNOSIS — O3680X0 Pregnancy with inconclusive fetal viability, not applicable or unspecified: Secondary | ICD-10-CM

## 2020-01-28 ENCOUNTER — Ambulatory Visit (INDEPENDENT_AMBULATORY_CARE_PROVIDER_SITE_OTHER): Payer: Medicaid Other

## 2020-01-28 ENCOUNTER — Other Ambulatory Visit: Payer: Self-pay

## 2020-01-28 DIAGNOSIS — Z3A08 8 weeks gestation of pregnancy: Secondary | ICD-10-CM | POA: Diagnosis not present

## 2020-01-28 DIAGNOSIS — O3680X0 Pregnancy with inconclusive fetal viability, not applicable or unspecified: Secondary | ICD-10-CM

## 2020-01-28 DIAGNOSIS — O418X10 Other specified disorders of amniotic fluid and membranes, first trimester, not applicable or unspecified: Secondary | ICD-10-CM

## 2020-01-28 DIAGNOSIS — D271 Benign neoplasm of left ovary: Secondary | ICD-10-CM

## 2020-01-28 NOTE — Progress Notes (Signed)
[place] 8+6wks,single IUP with YS,CRL 22.29 mm,positive fht 167 bpm,subchorionic hemorrhage 1.9 x 1.2 x 1.6 cm,normal right ovary,1.9 x 1 x 1 cm hyperechoic nodule left ovary with posterior shadowing ( ? Dermoid)

## 2020-02-15 ENCOUNTER — Other Ambulatory Visit: Payer: Medicaid Other

## 2020-02-23 ENCOUNTER — Other Ambulatory Visit: Payer: Self-pay | Admitting: Obstetrics and Gynecology

## 2020-02-23 DIAGNOSIS — Z3682 Encounter for antenatal screening for nuchal translucency: Secondary | ICD-10-CM

## 2020-02-24 ENCOUNTER — Encounter: Payer: Self-pay | Admitting: Advanced Practice Midwife

## 2020-02-24 ENCOUNTER — Ambulatory Visit: Payer: Medicaid Other | Admitting: *Deleted

## 2020-02-24 ENCOUNTER — Encounter: Payer: Self-pay | Admitting: Pediatrics

## 2020-02-24 ENCOUNTER — Ambulatory Visit (INDEPENDENT_AMBULATORY_CARE_PROVIDER_SITE_OTHER): Payer: Medicaid Other | Admitting: Advanced Practice Midwife

## 2020-02-24 ENCOUNTER — Other Ambulatory Visit: Payer: Self-pay

## 2020-02-24 ENCOUNTER — Ambulatory Visit (INDEPENDENT_AMBULATORY_CARE_PROVIDER_SITE_OTHER): Payer: Medicaid Other

## 2020-02-24 VITALS — BP 100/71 | HR 71 | Wt 129.0 lb

## 2020-02-24 DIAGNOSIS — Z3A12 12 weeks gestation of pregnancy: Secondary | ICD-10-CM | POA: Diagnosis not present

## 2020-02-24 DIAGNOSIS — Z3682 Encounter for antenatal screening for nuchal translucency: Secondary | ICD-10-CM | POA: Diagnosis not present

## 2020-02-24 DIAGNOSIS — Z3481 Encounter for supervision of other normal pregnancy, first trimester: Secondary | ICD-10-CM

## 2020-02-24 DIAGNOSIS — Z3480 Encounter for supervision of other normal pregnancy, unspecified trimester: Secondary | ICD-10-CM | POA: Insufficient documentation

## 2020-02-24 LAB — POCT URINALYSIS DIPSTICK OB
Blood, UA: NEGATIVE
Glucose, UA: NEGATIVE
Ketones, UA: NEGATIVE
Leukocytes, UA: NEGATIVE
Nitrite, UA: POSITIVE

## 2020-02-24 MED ORDER — BLOOD PRESSURE MONITOR MISC: 0 refills | Status: DC

## 2020-02-24 MED ORDER — NITROFURANTOIN MONOHYD MACRO 100 MG PO CAPS: 100.0000 mg | ORAL_CAPSULE | Freq: Two times a day (BID) | ORAL | 0 refills | Status: DC

## 2020-02-24 NOTE — Progress Notes (Signed)
[place] 12+5 wks,measurements c/w dates,crl 62.62 mm,NB present,NT 1.5 mm,FHT 162 bpm,normal right ovary,dermoid left ovary 1.4 x 1.2 x 1.2 cm,subchorionic hemorrhage 3.4 x .9 x 3.7 cm

## 2020-02-24 NOTE — Progress Notes (Signed)
INITIAL OBSTETRICAL VISIT Patient name: Lindsay Blackwell MRN [id number]  Date of birth: [birth year] Chief Complaint:   Initial Prenatal Visit  History of Present Illness:   Lindsay Blackwell is a 19 y.o. G10P1001 Hispanic female at [redacted]w[redacted]d by LMP c/w 8wk scan with an Estimated Date of Delivery: 09/02/20 being seen today for her initial obstetrical visit.   Her obstetrical history is significant for SVB x 1.   Today she reports  feels nauseous but it improves when she drinks water; also c/o foul-smelling, dark-colored uring but no dysuria or fever.   Depression screen [facility] 2/9 02/24/2020 03/03/2018  Decreased Interest 2 0  Down, Depressed, Hopeless 1 1  PHQ - 2 Score 3 1  Altered sleeping 2 1  Tired, decreased energy 2 1  Change in appetite 2 0  Feeling bad or failure about yourself  1 0  Trouble concentrating 1 0  Moving slowly or fidgety/restless 0 0  Suicidal thoughts 0 0  PHQ-9 Score 11 3  Difficult doing work/chores Somewhat difficult -    Patient's last menstrual period was 11/27/2019. Last pap <21yo.  Review of Systems:   Pertinent items are noted in HPI Denies cramping/contractions, leakage of fluid, vaginal bleeding, abnormal vaginal discharge w/ itching/odor/irritation, headaches, visual changes, shortness of breath, chest pain, abdominal pain, severe nausea/vomiting, or problems with urination or bowel movements unless otherwise stated above.  Pertinent History Reviewed:  Reviewed past medical,surgical, social, obstetrical and family history.  Reviewed problem list, medications and allergies. OB History  Gravida Para Term Preterm AB Living  2 1 1  0 0 1  SAB TAB Ectopic Multiple Live Births  0 0 0 0 1    # Outcome Date GA Lbr Len/2nd Weight Sex Delivery Anes PTL Lv  2 Current           1 Term 09/06/18 [redacted]w[redacted]d 02:22 / 00:24 6 lb 12.3 oz (3.07 kg) F Vag-Spont None N LIV   Physical Assessment:   Vitals:   02/24/20 1030  BP: 100/71  Pulse: 71  Weight: 129 lb  (58.5 kg)  Body mass index is 22.85 kg/m.       Physical Examination:  General appearance - well appearing, and in no distress  Mental status - alert, oriented to person, place, and time  Psych:  She has a normal mood and affect  Skin - warm and dry, normal color, no suspicious lesions noted  Chest - effort normal, all lung fields clear to auscultation bilaterally  Heart - normal rate and regular rhythm  Abdomen - soft, nontender  Extremities:  No swelling or varicosities noted  Pelvic - VULVA: normal appearing vulva with no masses, tenderness or lesions  VAGINA: normal appearing vagina with normal color and discharge, no lesions  CERVIX: normal appearing cervix without discharge or lesions, no CMT  Thin prep pap is not done  TODAY'S NT [place] 12+5 wks,measurements c/w dates,crl 62.62 mm,NB present,NT 1.5 mm,FHT 162 bpm,normal right ovary,dermoid left ovary 1.4 x 1.2 x 1.2 cm,subchorionic hemorrhage 3.4 x .9 x 3.7 cm  Results for orders placed or performed in visit on 02/24/20 (from the past 24 hour(s))  POC Urinalysis Dipstick OB   Collection Time: 02/24/20 10:42 AM  Result Value Ref Range   Color, UA     Clarity, UA     Glucose, UA Negative Negative   Bilirubin, UA     Ketones, UA neg    Spec Grav, UA     Blood, UA neg  pH, UA     POC,PROTEIN,UA Small (1+) Negative, Trace, Small (1+), Moderate (2+), Large (3+), 4+   Urobilinogen, UA     Nitrite, UA positive    Leukocytes, UA Negative Negative   Appearance     Odor      Assessment & Plan:  1) Low-Risk Pregnancy G2P1001 at [redacted]w[redacted]d with an Estimated Date of Delivery: 09/02/20   2) Initial OB visit  3) Urine suspicious for UTI, rx Macrobid and urine to culture  4) Moderately depressed according to PHQ-9, forgot to address during visit, but MyChart message sent to pt asking about counseling/meds in the past or if she needs the currently  Meds:  Meds ordered this encounter  Medications  . Blood Pressure Monitor MISC    Sig:  For regular home bp monitoring during pregnancy    Dispense:  1 each    Refill:  0    Z34.80  . nitrofurantoin, macrocrystal-monohydrate, (MACROBID) 100 MG capsule    Sig: Take 1 capsule (100 mg total) by mouth 2 (two) times daily.    Dispense:  14 capsule    Refill:  0    Order Specific Question:   Supervising Provider    Answer:   Jonnie Kind [phone]    Initial labs obtained Continue prenatal vitamins Reviewed n/v relief measures and warning s/s to report Reviewed recommended weight gain based on pre-gravid BMI Encouraged well-balanced diet Genetic & carrier screening discussed: requests [place] and NT/IT, declines carrier screening. Ultrasound discussed; fetal survey: requested [place] completed> form faxed if has or is planning to apply for medicaid The nature of [place] for [employer] with multiple MDs and other [place] Providers was explained to patient; also emphasized that fellows, residents, and students are part of our team. Needs home bp cuff. Rx given. Check bp weekly, let us know if >140/90.   No indications for ASA therapy or early HgbA1c (per uptodate)   Follow-up: Return in about 6 weeks (around 04/06/2020) for [place], [place]: Anatomy, 2nd IT.   Orders Placed This Encounter  Procedures  . GC/Chlamydia Probe Amp  . Urine Culture  . US OB Comp + 14 Wk  . Integrated 1  . Hepatitis C antibody  . Obstetric Panel, Including HIV  . Pain Management Screening Profile (10S)  . Genetic Screening  . POC Urinalysis Dipstick OB    Myrtis Ser [facility] 02/24/2020 11:14 AM

## 2020-02-24 NOTE — Patient Instructions (Signed)
Henderson Cloud, I greatly value your feedback.  If you receive a survey following your visit with Lindsay Blackwell today, we appreciate you taking the time to fill it out.  Thanks, Derrill Memo CNM   [facility] [facility] ([place], [place] [address]) Entrance C, located off of [address] parking   Nausea & Vomiting  Have saltine crackers or pretzels by your bed and eat a few bites before you raise your head out of bed in the morning  Eat small frequent meals throughout the day instead of large meals  Drink plenty of fluids throughout the day to stay hydrated, just don't drink a lot of fluids with your meals.  This can make your stomach fill up faster making you feel sick  Do not brush your teeth right after you eat  Products with real ginger are good for nausea, like ginger ale and ginger hard candy Make sure it says made with real ginger!  Sucking on sour candy like lemon heads is also good for nausea  If your prenatal vitamins make you nauseated, take them at night so you will sleep through the nausea  Sea Bands  If you feel like you need medicine for the nausea & vomiting please let Lindsay Blackwell know  If you are unable to keep any fluids or food down please let Lindsay Blackwell know   Constipation  Drink plenty of fluid, preferably water, throughout the day  Eat foods high in fiber such as fruits, vegetables, and grains  Exercise, such as walking, is a good way to keep your bowels regular  Drink warm fluids, especially warm prune juice, or decaf coffee  Eat a 1/2 cup of real oatmeal (not instant), 1/2 cup applesauce, and 1/2-1 cup warm prune juice every day  If needed, you may take Colace (docusate sodium) stool softener once or twice a day to help keep the stool soft.   If you still are having problems with constipation, you may take Miralax once daily as needed to help keep your bowels regular.   Home Blood Pressure Monitoring for Patients   Your  provider has recommended that you check your blood pressure (BP) at least once a week at home. If you do not have a blood pressure cuff at home, one will be provided for you. Contact your provider if you have not received your monitor within 1 week.   Helpful Tips for Accurate Home Blood Pressure Checks  . Don't smoke, exercise, or drink caffeine 30 minutes before checking your BP . Use the restroom before checking your BP (a full bladder can raise your pressure) . Relax in a comfortable upright chair . Feet on the ground . Left arm resting comfortably on a flat surface at the level of your heart . Legs uncrossed . Back supported . Sit quietly and don't talk . Place the cuff on your bare arm . Adjust snuggly, so that only two fingertips can fit between your skin and the top of the cuff . Check 2 readings separated by at least one minute . Keep a log of your BP readings . For a visual, please reference this diagram: http://ccnc.care/bpdiagram  Provider Name: [facility]     Phone: [phone]  Zone 1: ALL CLEAR  Continue to monitor your symptoms:  . BP reading is less than 140 (top number) or less than 90 (bottom number)  . No right upper stomach pain . No headaches or seeing  spots . No feeling nauseated or throwing up . No swelling in face and hands  Zone 2: CAUTION Call your doctor's office for any of the following:  . BP reading is greater than 140 (top number) or greater than 90 (bottom number)  . Stomach pain under your ribs in the middle or right side . Headaches or seeing spots . Feeling nauseated or throwing up . Swelling in face and hands  Zone 3: EMERGENCY  Seek immediate medical care if you have any of the following:  . BP reading is greater than160 (top number) or greater than 110 (bottom number) . Severe headaches not improving with Tylenol . Serious difficulty catching your breath . Any worsening symptoms from Zone 2    First Trimester of Pregnancy The  first trimester of pregnancy is from week 1 until the end of week 12 (months 1 through 3). A week after a sperm fertilizes an egg, the egg will implant on the wall of the uterus. This embryo will begin to develop into a baby. Genes from you and your partner are forming the baby. The female genes determine whether the baby is a boy or a girl. At 6-8 weeks, the eyes and face are formed, and the heartbeat can be seen on ultrasound. At the end of 12 weeks, all the baby's organs are formed.  Now that you are pregnant, you will want to do everything you can to have a healthy baby. Two of the most important things are to get good prenatal care and to follow your health care provider's instructions. Prenatal care is all the medical care you receive before the baby's birth. This care will help prevent, find, and treat any problems during the pregnancy and childbirth. BODY CHANGES Your body goes through many changes during pregnancy. The changes vary from woman to woman.   You may gain or lose a couple of pounds at first.  You may feel sick to your stomach (nauseous) and throw up (vomit). If the vomiting is uncontrollable, call your health care provider.  You may tire easily.  You may develop headaches that can be relieved by medicines approved by your health care provider.  You may urinate more often. Painful urination may mean you have a bladder infection.  You may develop heartburn as a result of your pregnancy.  You may develop constipation because certain hormones are causing the muscles that push waste through your intestines to slow down.  You may develop hemorrhoids or swollen, bulging veins (varicose veins).  Your breasts may begin to grow larger and become tender. Your nipples may stick out more, and the tissue that surrounds them (areola) may become darker.  Your gums may bleed and may be sensitive to brushing and flossing.  Dark spots or blotches (chloasma, mask of pregnancy) may develop on  your face. This will likely fade after the baby is born.  Your menstrual periods will stop.  You may have a loss of appetite.  You may develop cravings for certain kinds of food.  You may have changes in your emotions from day to day, such as being excited to be pregnant or being concerned that something may go wrong with the pregnancy and baby.  You may have more vivid and strange dreams.  You may have changes in your hair. These can include thickening of your hair, rapid growth, and changes in texture. Some women also have hair loss during or after pregnancy, or hair that feels dry or thin. Your hair  will most likely return to normal after your baby is born. WHAT TO EXPECT AT YOUR PRENATAL VISITS During a routine prenatal visit:  You will be weighed to make sure you and the baby are growing normally.  Your blood pressure will be taken.  Your abdomen will be measured to track your baby's growth.  The fetal heartbeat will be listened to starting around week 10 or 12 of your pregnancy.  Test results from any previous visits will be discussed. Your health care provider may ask you:  How you are feeling.  If you are feeling the baby move.  If you have had any abnormal symptoms, such as leaking fluid, bleeding, severe headaches, or abdominal cramping.  If you have any questions. Other tests that may be performed during your first trimester include:  Blood tests to find your blood type and to check for the presence of any previous infections. They will also be used to check for low iron levels (anemia) and Rh antibodies. Later in the pregnancy, blood tests for diabetes will be done along with other tests if problems develop.  Urine tests to check for infections, diabetes, or protein in the urine.  An ultrasound to confirm the proper growth and development of the baby.  An amniocentesis to check for possible genetic problems.  Fetal screens for spina bifida and Down  syndrome.  You may need other tests to make sure you and the baby are doing well. HOME CARE INSTRUCTIONS  Medicines  Follow your health care provider's instructions regarding medicine use. Specific medicines may be either safe or unsafe to take during pregnancy.  Take your prenatal vitamins as directed.  If you develop constipation, try taking a stool softener if your health care provider approves. Diet  Eat regular, well-balanced meals. Choose a variety of foods, such as meat or vegetable-based protein, fish, milk and low-fat dairy products, vegetables, fruits, and whole grain breads and cereals. Your health care provider will help you determine the amount of weight gain that is right for you.  Avoid raw meat and uncooked cheese. These carry germs that can cause birth defects in the baby.  Eating four or five small meals rather than three large meals a day may help relieve nausea and vomiting. If you start to feel nauseous, eating a few soda crackers can be helpful. Drinking liquids between meals instead of during meals also seems to help nausea and vomiting.  If you develop constipation, eat more high-fiber foods, such as fresh vegetables or fruit and whole grains. Drink enough fluids to keep your urine clear or pale yellow. Activity and Exercise  Exercise only as directed by your health care provider. Exercising will help you:  Control your weight.  Stay in shape.  Be prepared for labor and delivery.  Experiencing pain or cramping in the lower abdomen or low back is a good sign that you should stop exercising. Check with your health care provider before continuing normal exercises.  Try to avoid standing for long periods of time. Move your legs often if you must stand in one place for a long time.  Avoid heavy lifting.  Wear low-heeled shoes, and practice good posture.  You may continue to have sex unless your health care provider directs you otherwise. Relief of Pain or  Discomfort  Wear a good support bra for breast tenderness.    Take warm sitz baths to soothe any pain or discomfort caused by hemorrhoids. Use hemorrhoid cream if your health care provider approves.  Rest with your legs elevated if you have leg cramps or low back pain.  If you develop varicose veins in your legs, wear support hose. Elevate your feet for 15 minutes, 3-4 times a day. Limit salt in your diet. Prenatal Care  Schedule your prenatal visits by the twelfth week of pregnancy. They are usually scheduled monthly at first, then more often in the last 2 months before delivery.  Write down your questions. Take them to your prenatal visits.  Keep all your prenatal visits as directed by your health care provider. Safety  Wear your seat belt at all times when driving.  Make a list of emergency phone numbers, including numbers for family, friends, the hospital, and police and fire departments. General Tips  Ask your health care provider for a referral to a local prenatal education class. Begin classes no later than at the beginning of month 6 of your pregnancy.  Ask for help if you have counseling or nutritional needs during pregnancy. Your health care provider can offer advice or refer you to specialists for help with various needs.  Do not use hot tubs, steam rooms, or saunas.  Do not douche or use tampons or scented sanitary pads.  Do not cross your legs for long periods of time.  Avoid cat litter boxes and soil used by cats. These carry germs that can cause birth defects in the baby and possibly loss of the fetus by miscarriage or stillbirth.  Avoid all smoking, herbs, alcohol, and medicines not prescribed by your health care provider. Chemicals in these affect the formation and growth of the baby.  Schedule a dentist appointment. At home, brush your teeth with a soft toothbrush and be gentle when you floss. SEEK MEDICAL CARE IF:   You have dizziness.  You have mild  pelvic cramps, pelvic pressure, or nagging pain in the abdominal area.  You have persistent nausea, vomiting, or diarrhea.  You have a bad smelling vaginal discharge.  You have pain with urination.  You notice increased swelling in your face, hands, legs, or ankles. SEEK IMMEDIATE MEDICAL CARE IF:   You have a fever.  You are leaking fluid from your vagina.  You have spotting or bleeding from your vagina.  You have severe abdominal cramping or pain.  You have rapid weight gain or loss.  You vomit blood or material that looks like coffee grounds.  You are exposed to Lindsay Blackwell measles and have never had them.  You are exposed to fifth disease or chickenpox.  You develop a severe headache.  You have shortness of breath.  You have any kind of trauma, such as from a fall or a car accident. Document Released: 10/16/2001 Document Revised: 03/08/2014 Document Reviewed: 09/01/2013 [facility] Patient Information 2015 [place], [place]. This information is not intended to replace advice given to you by your health care provider. Make sure you discuss any questions you have with your health care provider.

## 2020-02-25 ENCOUNTER — Other Ambulatory Visit: Payer: Medicaid Other

## 2020-02-25 LAB — GC/CHLAMYDIA PROBE AMP
Chlamydia trachomatis, NAA: NEGATIVE
Neisseria Gonorrhoeae by PCR: NEGATIVE

## 2020-02-25 LAB — MED LIST OPTION NOT SELECTED

## 2020-02-26 LAB — OBSTETRIC PANEL, INCLUDING HIV
Antibody Screen: NEGATIVE
Basophils Absolute: 0 10*3/uL (ref 0.0–0.2)
Basos: 0 %
EOS (ABSOLUTE): 0 10*3/uL (ref 0.0–0.4)
Eos: 0 %
HIV Screen 4th Generation wRfx: NONREACTIVE
Hematocrit: 35.7 % (ref 34.0–46.6)
Hemoglobin: 12.3 g/dL (ref 11.1–15.9)
Hepatitis B Surface Ag: NEGATIVE
Immature Grans (Abs): 0 10*3/uL (ref 0.0–0.1)
Immature Granulocytes: 0 %
Lymphocytes Absolute: 2.2 10*3/uL (ref 0.7–3.1)
Lymphs: 29 %
MCH: 28.1 pg (ref 26.6–33.0)
MCHC: 34.5 g/dL (ref 31.5–35.7)
MCV: 82 fL (ref 79–97)
Monocytes Absolute: 0.5 10*3/uL (ref 0.1–0.9)
Monocytes: 6 %
Neutrophils Absolute: 4.9 10*3/uL (ref 1.4–7.0)
Neutrophils: 65 %
Platelets: 190 10*3/uL (ref 150–450)
RBC: 4.37 x10E6/uL (ref 3.77–5.28)
RDW: 14 % (ref 11.7–15.4)
RPR Ser Ql: NONREACTIVE
Rh Factor: POSITIVE
Rubella Antibodies, IGG: 2.83 index (ref >0.99)
WBC: 7.7 10*3/uL (ref 3.4–10.8)

## 2020-02-26 LAB — PMP SCREEN PROFILE (10S), URINE
Amphetamine Scrn, Ur: NEGATIVE ng/mL
BARBITURATE SCREEN URINE: NEGATIVE ng/mL
BENZODIAZEPINE SCREEN, URINE: NEGATIVE ng/mL
CANNABINOIDS UR QL SCN: NEGATIVE ng/mL
Cocaine (Metab) Scrn, Ur: NEGATIVE ng/mL
Creatinine(Crt), U: 292.6 mg/dL (ref 20.0–300.0)
Methadone Screen, Urine: NEGATIVE ng/mL
OXYCODONE+OXYMORPHONE UR QL SCN: NEGATIVE ng/mL
Opiate Scrn, Ur: NEGATIVE ng/mL
Ph of Urine: 7.9 (ref 4.5–8.9)
Phencyclidine Qn, Ur: NEGATIVE ng/mL
Propoxyphene Scrn, Ur: NEGATIVE ng/mL

## 2020-02-26 LAB — URINE CULTURE

## 2020-02-26 LAB — INTEGRATED 1
Crown Rump Length: 62.6 mm
Gest. Age on Collection Date: 12.4 weeks
Maternal Age at EDD: 21.3 yr
Nuchal Translucency (NT): 1.5 mm
Number of Fetuses: 1
PAPP-A Value: 1085.3 ng/mL
Weight: 129 [lb_av]

## 2020-02-26 LAB — HEPATITIS C ANTIBODY: Hep C Virus Ab: <0.1 s/co ratio (ref 0.0–0.9)

## 2020-03-02 ENCOUNTER — Encounter: Payer: Self-pay | Admitting: Advanced Practice Midwife

## 2020-03-02 DIAGNOSIS — O2341 Unspecified infection of urinary tract in pregnancy, first trimester: Secondary | ICD-10-CM | POA: Insufficient documentation

## 2020-04-05 ENCOUNTER — Other Ambulatory Visit: Payer: Self-pay | Admitting: Advanced Practice Midwife

## 2020-04-05 DIAGNOSIS — Z3480 Encounter for supervision of other normal pregnancy, unspecified trimester: Secondary | ICD-10-CM

## 2020-04-05 DIAGNOSIS — Z363 Encounter for antenatal screening for malformations: Secondary | ICD-10-CM

## 2020-04-06 ENCOUNTER — Encounter: Payer: Self-pay | Admitting: Family Medicine

## 2020-04-06 ENCOUNTER — Ambulatory Visit (INDEPENDENT_AMBULATORY_CARE_PROVIDER_SITE_OTHER): Payer: Medicaid Other | Admitting: Family Medicine

## 2020-04-06 ENCOUNTER — Ambulatory Visit (INDEPENDENT_AMBULATORY_CARE_PROVIDER_SITE_OTHER): Payer: Medicaid Other

## 2020-04-06 VITALS — BP 103/63 | HR 84 | Wt 131.0 lb

## 2020-04-06 DIAGNOSIS — O2341 Unspecified infection of urinary tract in pregnancy, first trimester: Secondary | ICD-10-CM

## 2020-04-06 DIAGNOSIS — B9689 Other specified bacterial agents as the cause of diseases classified elsewhere: Secondary | ICD-10-CM

## 2020-04-06 DIAGNOSIS — Z3480 Encounter for supervision of other normal pregnancy, unspecified trimester: Secondary | ICD-10-CM

## 2020-04-06 DIAGNOSIS — Z3482 Encounter for supervision of other normal pregnancy, second trimester: Secondary | ICD-10-CM

## 2020-04-06 DIAGNOSIS — Z363 Encounter for antenatal screening for malformations: Secondary | ICD-10-CM | POA: Diagnosis not present

## 2020-04-06 DIAGNOSIS — Z3A18 18 weeks gestation of pregnancy: Secondary | ICD-10-CM | POA: Diagnosis not present

## 2020-04-06 DIAGNOSIS — Z331 Pregnant state, incidental: Secondary | ICD-10-CM

## 2020-04-06 DIAGNOSIS — Z1389 Encounter for screening for other disorder: Secondary | ICD-10-CM

## 2020-04-06 LAB — POCT URINALYSIS DIPSTICK OB
Blood, UA: NEGATIVE
Glucose, UA: NEGATIVE
Ketones, UA: NEGATIVE
Leukocytes, UA: NEGATIVE
Nitrite, UA: NEGATIVE
POC,PROTEIN,UA: NEGATIVE

## 2020-04-06 NOTE — Progress Notes (Signed)
   PRENATAL VISIT NOTE  Subjective:  Lindsay Blackwell is a 19 y.o. G2P1001 at [redacted]w[redacted]d being seen today for ongoing prenatal care.  She is currently monitored for the following issues for this low-risk pregnancy and has Encounter for supervision of other normal pregnancy, unspecified trimester and UTI in pregnancy, antepartum, first trimester on their problem list.  Patient reports no complaints.  Contractions: Not present. Vag. Bleeding: None.  Movement: Present. Denies leaking of fluid.   The following portions of the patient's history were reviewed and updated as appropriate: allergies, current medications, past family history, past medical history, past social history, past surgical history and problem list.   Objective:   Vitals:   04/06/20 0915  BP: 103/63  Pulse: 84  Weight: 131 lb (59.4 kg)    Fetal Status: Fetal Heart Rate (bpm): 145   Movement: Present     General:  Alert, oriented and cooperative. Patient is in no acute distress.  Skin: Skin is warm and dry. No rash noted.   Cardiovascular: Normal heart rate noted  Respiratory: Normal respiratory effort, no problems with respiration noted  Abdomen: Soft, gravid, appropriate for gestational age.  Pain/Pressure: Absent     Pelvic: Cervical exam deferred        Extremities: Normal range of motion.     Mental Status: Normal mood and affect. Normal behavior. Normal judgment and thought content.   Assessment and Plan:  Pregnancy: G2P1001 at [redacted]w[redacted]d 1. Encounter for supervision of other normal pregnancy, unspecified trimester - RTC in 4 weeks - Initial labs with exception of UTI WNL; repeat cx today - No UTI symptoms today  - Anatomy scan today  - Urine Culture - INTEGRATED 2  2. UTI in pregnancy, antepartum, first trimester - s/p Macrobid, repeat ur cx today  3. Pregnancy, incidental - POC Urinalysis Dipstick OB  4. Screening for genitourinary condition - POC Urinalysis Dipstick OB  5. [redacted] weeks gestation of  pregnancy - INTEGRATED 2  Preterm labor symptoms and general obstetric precautions including but not limited to vaginal bleeding, contractions, leaking of fluid and fetal movement were reviewed in detail with the patient. Please refer to After Visit Summary for other counseling recommendations.   Return in about 4 weeks (around 05/04/2020) for LOB; in person or virtual per patient preference .  Future Appointments  Date Time Provider [place]  04/06/2020  9:30 AM Sahra Converse, Marin Shutter, MD [facility]    Chauncey Mann, MD

## 2020-04-06 NOTE — Progress Notes (Signed)
[place] 18+5 wks,breech,fundal placenta gr 0,svp of fluid 4.5 cm,cx 3.9 cm,normal right ovary,left ovary dermoid 1.4 cm N/C,fhr 157 bpm,efw 259 g 51%,anatomy complete,no obvious abnormalities

## 2020-04-08 LAB — URINE CULTURE

## 2020-04-08 LAB — INTEGRATED 2
AFP MoM: 1.33
Alpha-Fetoprotein: 65.2 ng/mL
Crown Rump Length: 62.6 mm
DIA MoM: 0.7
DIA Value: 122.4 pg/mL
Estriol, Unconjugated: 2.52 ng/mL
Gest. Age on Collection Date: 12.4 weeks
Gestational Age: 18.4 weeks
Maternal Age at EDD: 21.3 yr
Nuchal Translucency (NT): 1.5 mm
Nuchal Translucency MoM: 1.07
Number of Fetuses: 1
PAPP-A MoM: 0.93
PAPP-A Value: 1085.3 ng/mL
Test Results:: NEGATIVE
Weight: 129 [lb_av]
Weight: 129 [lb_av]
hCG MoM: 1.42
hCG Value: 37 IU/mL
uE3 MoM: 1.56

## 2020-05-04 ENCOUNTER — Telehealth: Payer: Medicaid Other | Admitting: Advanced Practice Midwife

## 2020-05-12 ENCOUNTER — Encounter: Payer: Medicaid Other | Admitting: Advanced Practice Midwife

## 2020-05-19 ENCOUNTER — Other Ambulatory Visit: Payer: Self-pay

## 2020-05-19 DIAGNOSIS — Z20822 Contact with and (suspected) exposure to covid-19: Secondary | ICD-10-CM | POA: Diagnosis not present

## 2020-05-20 LAB — NOVEL CORONAVIRUS, NAA: SARS-CoV-2, NAA: NOT DETECTED

## 2020-05-20 LAB — SARS-COV-2, NAA 2 DAY TAT

## 2020-05-24 ENCOUNTER — Encounter: Payer: Self-pay | Admitting: *Deleted

## 2020-05-24 ENCOUNTER — Telehealth: Payer: Self-pay | Admitting: Obstetrics and Gynecology

## 2020-05-24 DIAGNOSIS — Z3480 Encounter for supervision of other normal pregnancy, unspecified trimester: Secondary | ICD-10-CM

## 2020-05-24 NOTE — Telephone Encounter (Signed)
Patient noticed some light pink spotting and is concerned about it and wants to know what to do.

## 2020-05-24 NOTE — Telephone Encounter (Signed)
Pt wiped light pink this am after urinating. Pt had a BM yesterday or day before. Last time she had sex was last week. Pt had urinary frequency and burning with urination yesterday. Not today. Has not wiped any more blood. I advised everything sounds ok at this point and to keep a check on spotting. Pt was advised no sex for at least 7 days from the last time she sees pink. Pt was also advised to drink plenty of fluids. Pt has an appt here tomorrow and was advised to keep that appt. Pt voiced understanding. [place]

## 2020-05-25 ENCOUNTER — Ambulatory Visit (INDEPENDENT_AMBULATORY_CARE_PROVIDER_SITE_OTHER): Payer: Medicaid Other | Admitting: Obstetrics and Gynecology

## 2020-05-25 ENCOUNTER — Encounter: Payer: Self-pay | Admitting: Obstetrics and Gynecology

## 2020-05-25 VITALS — BP 107/67 | HR 78 | Wt 141.6 lb

## 2020-05-25 DIAGNOSIS — Z3A25 25 weeks gestation of pregnancy: Secondary | ICD-10-CM

## 2020-05-25 DIAGNOSIS — Z1389 Encounter for screening for other disorder: Secondary | ICD-10-CM

## 2020-05-25 DIAGNOSIS — Z3480 Encounter for supervision of other normal pregnancy, unspecified trimester: Secondary | ICD-10-CM

## 2020-05-25 DIAGNOSIS — Z331 Pregnant state, incidental: Secondary | ICD-10-CM

## 2020-05-25 DIAGNOSIS — O2342 Unspecified infection of urinary tract in pregnancy, second trimester: Secondary | ICD-10-CM

## 2020-05-25 LAB — POCT URINALYSIS DIPSTICK OB
Blood, UA: NEGATIVE
Glucose, UA: NEGATIVE
Ketones, UA: NEGATIVE
Nitrite, UA: POSITIVE
POC,PROTEIN,UA: NEGATIVE

## 2020-05-25 MED ORDER — CEPHALEXIN 500 MG PO CAPS
500.0000 mg | ORAL_CAPSULE | Freq: Four times a day (QID) | ORAL | 0 refills | Status: DC
Start: 2020-05-25 — End: 2020-06-07

## 2020-05-25 NOTE — Progress Notes (Signed)
Patient ID: Lindsay Blackwell, female   DOB: 2000-12-21, 21 y.o.   MRN: [id number]  LOW-RISK PREGNANCY VISIT Patient name: Lindsay Blackwell MRN [id number]  Date of birth: 11/23/2000 Chief Complaint:   Routine Prenatal Visit (burning with urination; urinary frequency)  History of Present Illness:   Lindsay Blackwell is a 19 y.o. G14P1001 female at [redacted]w[redacted]d with an Estimated Date of Delivery: 09/02/20 being seen today for ongoing management of a low-risk pregnancy.  Today she reports burning with urination that began again 3 days ago. She checks her blood pressures at home. This is her second pregnancy. She started having a little bit of light pink vaginal bleeding last week. Denies contractions  Contractions: Not present. Vag. Bleeding: Scant.  Movement: Present. denies leaking of fluid. Review of Systems:   Pertinent items are noted in HPI Denies abnormal vaginal discharge w/ itching/odor/irritation, headaches, visual changes, shortness of breath, chest pain, abdominal pain, severe nausea/vomiting, or problems with urination or bowel movements unless otherwise stated above. Pertinent History Reviewed:  Reviewed past medical,surgical, social, obstetrical and family history.  Reviewed problem list, medications and allergies. Physical Assessment:   Vitals:   05/25/20 1445  BP: 107/67  Pulse: 78  Weight: 141 lb 9.6 oz (64.2 kg)  Body mass index is 25.08 kg/m.        Physical Examination:   General appearance: Well appearing, and in no distress  Mental status: Alert, oriented to person, place, and time  Skin: Warm & dry  Cardiovascular: Normal heart rate noted  Respiratory: Normal respiratory effort, no distress  Abdomen: Soft, gravid, nontender  Pelvic: Cervical exam deferred         Extremities: Edema: None  Fetal Status: Fetal Heart Rate (bpm): 131 Fundal Height: 27 cm Movement: Present    Results for orders placed or performed in visit on 05/25/20 (from the past 24 hour(s))   POC Urinalysis Dipstick OB   Collection Time: 05/25/20  2:47 PM  Result Value Ref Range   Color, UA     Clarity, UA     Glucose, UA Negative Negative   Bilirubin, UA     Ketones, UA neg    Spec Grav, UA     Blood, UA neg    pH, UA     POC,PROTEIN,UA Negative Negative, Trace, Small (1+), Moderate (2+), Large (3+), 4+   Urobilinogen, UA     Nitrite, UA positive    Leukocytes, UA Moderate (2+) (A) Negative   Appearance     Odor      Assessment & Plan:  1) Low-risk pregnancy G2P1001 at [redacted]w[redacted]d with an Estimated Date of Delivery: 09/02/20   2) UTI, Rx Keflex  3) Mild light pink vaginal bleeding during pregnancy, pt declines pelvic exam at this time. She will follow up if bleeding continues or worsens.   Plan:  Continue routine obstetrical care  Meds: No orders of the defined types were placed in this encounter.  Labs/procedures today: none  Follow-up: Return in about 4 weeks (around 06/22/2020) for LROB.  By signing my name below, I, De Burrs, attest that this documentation has been prepared under the direction and in the presence of Jonnie Kind, MD Electronically Signed: De Burrs, Medical Scribe. 05/25/20. 3:11 PM.  I personally performed the services described in this documentation, which was SCRIBED in my presence. The recorded information has been reviewed and considered accurate. It has been edited as necessary during review. Jonnie Kind, MD

## 2020-05-28 LAB — URINE CULTURE

## 2020-05-29 ENCOUNTER — Other Ambulatory Visit: Payer: Self-pay | Admitting: Obstetrics and Gynecology

## 2020-05-29 MED ORDER — NITROFURANTOIN MACROCRYSTAL 100 MG PO CAPS: 100.0000 mg | ORAL_CAPSULE | Freq: Every day | ORAL | 1 refills | Status: DC

## 2020-05-29 NOTE — Progress Notes (Signed)
Macrodantin supression upon completion of current keflex. Sent in.Marland Kitchen

## 2020-06-05 ENCOUNTER — Encounter (HOSPITAL_COMMUNITY): Payer: Self-pay | Admitting: Obstetrics and Gynecology

## 2020-06-05 ENCOUNTER — Inpatient Hospital Stay (HOSPITAL_COMMUNITY): Payer: Medicaid Other

## 2020-06-05 ENCOUNTER — Inpatient Hospital Stay (HOSPITAL_COMMUNITY)
Admission: AD | Admit: 2020-06-05 | Discharge: 2020-06-07 | DRG: 833 | Disposition: A | Discharge status: Home / Self Care | Payer: Medicaid Other | Attending: Obstetrics and Gynecology | Admitting: Obstetrics and Gynecology

## 2020-06-05 ENCOUNTER — Other Ambulatory Visit: Payer: Self-pay

## 2020-06-05 DIAGNOSIS — B9620 Unspecified Escherichia coli [E. coli] as the cause of diseases classified elsewhere: Secondary | ICD-10-CM | POA: Diagnosis present

## 2020-06-05 DIAGNOSIS — Z20822 Contact with and (suspected) exposure to covid-19: Secondary | ICD-10-CM | POA: Diagnosis not present

## 2020-06-05 DIAGNOSIS — O2302 Infections of kidney in pregnancy, second trimester: Principal | ICD-10-CM | POA: Diagnosis present

## 2020-06-05 DIAGNOSIS — Z3A27 27 weeks gestation of pregnancy: Secondary | ICD-10-CM | POA: Diagnosis not present

## 2020-06-05 DIAGNOSIS — O2303 Infections of kidney in pregnancy, third trimester: Secondary | ICD-10-CM | POA: Diagnosis present

## 2020-06-05 DIAGNOSIS — N1330 Unspecified hydronephrosis: Secondary | ICD-10-CM | POA: Diagnosis not present

## 2020-06-05 LAB — URINALYSIS, ROUTINE W REFLEX MICROSCOPIC
Bilirubin Urine: NEGATIVE
Glucose, UA: NEGATIVE mg/dL
Ketones, ur: NEGATIVE mg/dL
Nitrite: NEGATIVE
Protein, ur: 100 mg/dL — AB
Specific Gravity, Urine: 1.019 (ref 1.005–1.030)
WBC, UA: >50 WBC/hpf — ABNORMAL HIGH (ref 0–5)
pH: 6 (ref 5.0–8.0)

## 2020-06-05 LAB — COMPREHENSIVE METABOLIC PANEL
ALT: 7 U/L (ref 0–44)
AST: 15 U/L (ref 15–41)
Albumin: 2.9 g/dL — ABNORMAL LOW (ref 3.5–5.0)
Alkaline Phosphatase: 84 U/L (ref 38–126)
Anion gap: 8 (ref 5–15)
BUN: 13 mg/dL (ref 6–20)
CO2: 24 mmol/L (ref 22–32)
Calcium: 8.4 mg/dL — ABNORMAL LOW (ref 8.9–10.3)
Chloride: 103 mmol/L (ref 98–111)
Creatinine, Ser: 0.78 mg/dL (ref 0.44–1.00)
GFR calc Af Amer: >60 mL/min (ref >60)
GFR calc non Af Amer: >60 mL/min (ref >60)
Glucose, Bld: 103 mg/dL — ABNORMAL HIGH (ref 70–99)
Potassium: 3.7 mmol/L (ref 3.5–5.1)
Sodium: 135 mmol/L (ref 135–145)
Total Bilirubin: 0.4 mg/dL (ref 0.3–1.2)
Total Protein: 6.2 g/dL — ABNORMAL LOW (ref 6.5–8.1)

## 2020-06-05 LAB — CBC WITH DIFFERENTIAL/PLATELET
Abs Immature Granulocytes: 0.11 10*3/uL — ABNORMAL HIGH (ref 0.00–0.07)
Basophils Absolute: 0 10*3/uL (ref 0.0–0.1)
Basophils Relative: 0 %
Eosinophils Absolute: 0 10*3/uL (ref 0.0–0.5)
Eosinophils Relative: 0 %
HCT: 31.7 % — ABNORMAL LOW (ref 36.0–46.0)
Hemoglobin: 10.4 g/dL — ABNORMAL LOW (ref 12.0–15.0)
Immature Granulocytes: 1 %
Lymphocytes Relative: 15 %
Lymphs Abs: 2 10*3/uL (ref 0.7–4.0)
MCH: 28.8 pg (ref 26.0–34.0)
MCHC: 32.8 g/dL (ref 30.0–36.0)
MCV: 87.8 fL (ref 80.0–100.0)
Monocytes Absolute: 0.9 10*3/uL (ref 0.1–1.0)
Monocytes Relative: 6 %
Neutro Abs: 10.6 10*3/uL — ABNORMAL HIGH (ref 1.7–7.7)
Neutrophils Relative %: 78 %
Platelets: 195 10*3/uL (ref 150–400)
RBC: 3.61 MIL/uL — ABNORMAL LOW (ref 3.87–5.11)
RDW: 12.8 % (ref 11.5–15.5)
WBC: 13.7 10*3/uL — ABNORMAL HIGH (ref 4.0–10.5)
nRBC: 0 % (ref 0.0–0.2)

## 2020-06-05 LAB — TYPE AND SCREEN
ABO/RH(D): O POS
Antibody Screen: NEGATIVE

## 2020-06-05 LAB — CBC
HCT: 30.6 % — ABNORMAL LOW (ref 36.0–46.0)
Hemoglobin: 10 g/dL — ABNORMAL LOW (ref 12.0–15.0)
MCH: 28.3 pg (ref 26.0–34.0)
MCHC: 32.7 g/dL (ref 30.0–36.0)
MCV: 86.7 fL (ref 80.0–100.0)
Platelets: 175 10*3/uL (ref 150–400)
RBC: 3.53 MIL/uL — ABNORMAL LOW (ref 3.87–5.11)
RDW: 12.9 % (ref 11.5–15.5)
WBC: 12.1 10*3/uL — ABNORMAL HIGH (ref 4.0–10.5)
nRBC: 0 % (ref 0.0–0.2)

## 2020-06-05 LAB — WET PREP, GENITAL
Clue Cells Wet Prep HPF POC: NONE SEEN
Sperm: NONE SEEN
Trich, Wet Prep: NONE SEEN
Yeast Wet Prep HPF POC: NONE SEEN

## 2020-06-05 LAB — SARS CORONAVIRUS 2 BY RT PCR (HOSPITAL ORDER, PERFORMED IN ~~LOC~~ HOSPITAL LAB): SARS Coronavirus 2: NEGATIVE

## 2020-06-05 LAB — RPR: RPR Ser Ql: NONREACTIVE

## 2020-06-05 MED ORDER — ENOXAPARIN SODIUM 40 MG/0.4ML ~~LOC~~ SOLN
40.0000 mg | SUBCUTANEOUS | Status: DC
  Administered 2020-06-05 – 2020-06-06 (×2): 40 mg via SUBCUTANEOUS
  Filled 2020-06-05 (×4): qty 0.4

## 2020-06-05 MED ORDER — PRENATAL MULTIVITAMIN CH
1.0000 | ORAL_TABLET | Freq: Every day | ORAL | Status: DC
  Administered 2020-06-05: 1 via ORAL
  Filled 2020-06-05 (×2): qty 1

## 2020-06-05 MED ORDER — ACETAMINOPHEN 325 MG PO TABS
650.0000 mg | ORAL_TABLET | ORAL | Status: DC | PRN
  Administered 2020-06-06: 650 mg via ORAL
  Filled 2020-06-05: qty 2

## 2020-06-05 MED ORDER — LACTATED RINGERS IV SOLN: INTRAVENOUS | Status: DC

## 2020-06-05 MED ORDER — SODIUM CHLORIDE 0.9 % IV SOLN
2.0000 g | INTRAVENOUS | Status: DC
  Administered 2020-06-05 – 2020-06-07 (×3): 2 g via INTRAVENOUS
  Filled 2020-06-05 (×4): qty 20

## 2020-06-05 MED ORDER — DOCUSATE SODIUM 100 MG PO CAPS
100.0000 mg | ORAL_CAPSULE | Freq: Every day | ORAL | Status: DC
  Administered 2020-06-05 – 2020-06-06 (×2): 100 mg via ORAL
  Filled 2020-06-05 (×2): qty 1

## 2020-06-05 MED ORDER — SODIUM CHLORIDE 0.9 % IV SOLN: INTRAVENOUS | Status: DC

## 2020-06-05 MED ORDER — LACTATED RINGERS IV BOLUS
1000.0000 mL | Freq: Once | INTRAVENOUS | Status: AC
  Administered 2020-06-05: 1000 mL via INTRAVENOUS

## 2020-06-05 MED ORDER — CALCIUM CARBONATE ANTACID 500 MG PO CHEW: 2.0000 | CHEWABLE_TABLET | ORAL | Status: DC | PRN

## 2020-06-05 MED ORDER — OXYCODONE-ACETAMINOPHEN 5-325 MG PO TABS
1.0000 | ORAL_TABLET | Freq: Four times a day (QID) | ORAL | Status: DC | PRN
  Administered 2020-06-05 – 2020-06-06 (×2): 1 via ORAL
  Filled 2020-06-05 (×2): qty 1

## 2020-06-05 MED ORDER — MORPHINE SULFATE (PF) 4 MG/ML IV SOLN
4.0000 mg | Freq: Once | INTRAVENOUS | Status: AC
  Administered 2020-06-05: 4 mg via INTRAVENOUS
  Filled 2020-06-05: qty 1

## 2020-06-05 NOTE — H&P (Signed)
FACULTY PRACTICE ANTEPARTUM ADMISSION HISTORY AND PHYSICAL NOTE   History of Present Illness: Lindsay Blackwell is a 19 y.o. G2P1001 at [redacted]w[redacted]d admitted for Pylenephritis.  Patient with recurrent UTIs during this pregnancy and treated with Macrobid in June and Keflex on July 21st.  Patient reports she has been taking Keflex, but has not completed prescription. Patient presented to MAU with onset of severe right side flank pain.  Patient denies issues with urination and reported vaginal pressure that she described as "the urge to push" when the pain occurred. While in MAU patient labs drawn, pain medication and IV fluids given and Renal US ordered.    Patient reports the fetal movement as active. Patient reports uterine contraction  activity as none. Patient reports  vaginal bleeding as none. Patient describes fluid per vagina as None. Fetal presentation is unsure.  Patient Active Problem List   Diagnosis Date Noted  . UTI in pregnancy, antepartum, first trimester 03/02/2020  . Encounter for supervision of other normal pregnancy, unspecified trimester 02/24/2020    Past Medical History:  Diagnosis Date  . Medical history non-contributory     Past Surgical History:  Procedure Laterality Date  . NO PAST SURGERIES      OB History  Gravida Para Term Preterm AB Living  2 1 1  0 0 1  SAB TAB Ectopic Multiple Live Births  0 0 0 0 1    # Outcome Date GA Lbr Len/2nd Weight Sex Delivery Anes PTL Lv  2 Current           1 Term 09/06/18 [redacted]w[redacted]d 02:22 / 00:24 3070 g F Vag-Spont None N LIV    Social History   Socioeconomic History  . Marital status: Single    Spouse name: Kirtland Bouchard  . Number of children: 1  . Years of education: Not on file  . Highest education level: Not on file  Occupational History  . Not on file  Tobacco Use  . Smoking status: Never Smoker  . Smokeless tobacco: Never Used  Vaping Use  . Vaping Use: Former  Substance and Sexual Activity  . Alcohol use: No  . Drug  use: No  . Sexual activity: Yes    Birth control/protection: None  Other Topics Concern  . Not on file  Social History Narrative  . Not on file   Social Determinants of Health   Financial Resource Strain: Low Risk   . Difficulty of Paying Living Expenses: Not hard at all  Food Insecurity: Food Insecurity Present  . Worried About Charity fundraiser in the Last Year: Sometimes true  . Ran Out of Food in the Last Year: Never true  Transportation Needs: No Transportation Needs  . Lack of Transportation (Medical): No  . Lack of Transportation (Non-Medical): No  Physical Activity: Insufficiently Active  . Days of Exercise per Week: 2 days  . Minutes of Exercise per Session: 20 min  Stress: No Stress Concern Present  . Feeling of Stress : Only a little  Social Connections: Moderately Integrated  . Frequency of Communication with Friends and Family: Twice a week  . Frequency of Social Gatherings with Friends and Family: Once a week  . Attends Religious Services: 1 to 4 times per year  . Active Member of Clubs or Organizations: No  . Attends Archivist Meetings: Never  . Marital Status: Living with partner    Family History  Problem Relation Age of Onset  . Diabetes Father     No  Known Allergies  Medications Prior to Admission  Medication Sig Dispense Refill Last Dose  . Prenatal Vit-Iron Carbonyl-FA (PRENATAL PLUS IRON) 29-1 MG TABS Take 1 tablet by mouth daily. 30 tablet 12 06/04/2020 at Unknown time  . Blood Pressure Monitor MISC For regular home bp monitoring during pregnancy 1 each 0   . cephALEXin (KEFLEX) 500 MG capsule Take 1 capsule (500 mg total) by mouth 4 (four) times daily. 28 capsule 0   . nitrofurantoin (MACRODANTIN) 100 MG capsule Take 1 capsule (100 mg total) by mouth at bedtime. 30 capsule 1   . promethazine (PHENERGAN) 25 MG tablet Take 1 tablet (25 mg total) by mouth every 6 (six) hours as needed for nausea or vomiting. (Patient not taking: Reported  on 02/24/2020) 30 tablet 1     Review of Systems - Negative except those stated in HPI  Vitals:  BP 122/82 (BP Location: Right Arm)   Pulse 93   Temp 98.4 F (36.9 C) (Oral)   Resp 17   Ht 5\' 3"  (1.6 m)   Wt 64 kg   LMP 11/27/2019   SpO2 100%   BMI 24.98 kg/m  Physical Examination: Physical Exam Vitals and nursing note reviewed.  Constitutional:      General: She is in acute distress.     Appearance: She is well-developed. She is not toxic-appearing or diaphoretic.     Comments: Patient tearful  HENT:     Head: Normocephalic and atraumatic.  Eyes:     Conjunctiva/sclera: Conjunctivae normal.  Cardiovascular:     Rate and Rhythm: Normal rate and regular rhythm.     Heart sounds: Normal heart sounds.  Pulmonary:     Effort: Pulmonary effort is normal. No respiratory distress.     Breath sounds: Normal breath sounds.  Abdominal:     General: Bowel sounds are normal.     Tenderness: There is right CVA tenderness. There is no left CVA tenderness.     Comments: Gravid--fundal height appears AGA, Soft, NT   Skin:    General: Skin is warm and dry.  Neurological:     Mental Status: She is alert and oriented to person, place, and time.  Psychiatric:        Mood and Affect: Mood normal.        Behavior: Behavior normal.        Thought Content: Thought content normal.        Judgment: Judgment normal.     Cervix: Evaluated by sterile speculum exam. and Evaluated by digital exam. and found to be <0.5 cm/ Long/Floating and fetal presentation is unsure. Membranes:intact Fetal Monitoring:Baseline: 135 bpm, Variability: Good {> 6 bpm), Accelerations: Reactive and Decelerations: Absent Tocometer: No ctx graphed  Labs:  Results for orders placed or performed during the hospital encounter of 06/05/20 (from the past 24 hour(s))  Urinalysis, Routine w reflex microscopic   Collection Time: 06/05/20 12:31 AM  Result Value Ref Range   Color, Urine YELLOW YELLOW   APPearance CLOUDY  (A) CLEAR   Specific Gravity, Urine 1.019 1.005 - 1.030   pH 6.0 5.0 - 8.0   Glucose, UA NEGATIVE NEGATIVE mg/dL   Hgb urine dipstick SMALL (A) NEGATIVE   Bilirubin Urine NEGATIVE NEGATIVE   Ketones, ur NEGATIVE NEGATIVE mg/dL   Protein, ur 100 (A) NEGATIVE mg/dL   Nitrite NEGATIVE NEGATIVE   Leukocytes,Ua LARGE (A) NEGATIVE   RBC / HPF 21-50 0 - 5 RBC/hpf   WBC, UA >50 (H) 0 - 5  WBC/hpf   Bacteria, UA MANY (A) NONE SEEN   Mucus PRESENT   Wet prep, genital   Collection Time: 06/05/20 12:45 AM  Result Value Ref Range   Yeast Wet Prep HPF POC NONE SEEN NONE SEEN   Trich, Wet Prep NONE SEEN NONE SEEN   Clue Cells Wet Prep HPF POC NONE SEEN NONE SEEN   WBC, Wet Prep HPF POC MANY (A) NONE SEEN   Sperm NONE SEEN   CBC with Differential/Platelet   Collection Time: 06/05/20  1:40 AM  Result Value Ref Range   WBC 13.7 (H) 4.0 - 10.5 K/uL   RBC 3.61 (L) 3.87 - 5.11 MIL/uL   Hemoglobin 10.4 (L) 12.0 - 15.0 g/dL   HCT 31.7 (L) 36 - 46 %   MCV 87.8 80.0 - 100.0 fL   MCH 28.8 26.0 - 34.0 pg   MCHC 32.8 30.0 - 36.0 g/dL   RDW 12.8 11.5 - 15.5 %   Platelets 195 150 - 400 K/uL   nRBC 0.0 0.0 - 0.2 %   Neutrophils Relative % 78 %   Neutro Abs 10.6 (H) 1.7 - 7.7 K/uL   Lymphocytes Relative 15 %   Lymphs Abs 2.0 0.7 - 4.0 K/uL   Monocytes Relative 6 %   Monocytes Absolute 0.9 0 - 1 K/uL   Eosinophils Relative 0 %   Eosinophils Absolute 0.0 0 - 0 K/uL   Basophils Relative 0 %   Basophils Absolute 0.0 0 - 0 K/uL   Immature Granulocytes 1 %   Abs Immature Granulocytes 0.11 (H) 0.00 - 0.07 K/uL  Comprehensive metabolic panel   Collection Time: 06/05/20  1:40 AM  Result Value Ref Range   Sodium 135 135 - 145 mmol/L   Potassium 3.7 3.5 - 5.1 mmol/L   Chloride 103 98 - 111 mmol/L   CO2 24 22 - 32 mmol/L   Glucose, Bld 103 (H) 70 - 99 mg/dL   BUN 13 6 - 20 mg/dL   Creatinine, Ser 0.78 0.44 - 1.00 mg/dL   Calcium 8.4 (L) 8.9 - 10.3 mg/dL   Total Protein 6.2 (L) 6.5 - 8.1 g/dL   Albumin  2.9 (L) 3.5 - 5.0 g/dL   AST 15 15 - 41 U/L   ALT 7 0 - 44 U/L   Alkaline Phosphatase 84 38 - 126 U/L   Total Bilirubin 0.4 0.3 - 1.2 mg/dL   GFR calc non Af Amer >60 >60 mL/min   GFR calc Af Amer >60 >60 mL/min   Anion gap 8 5 - 15    Imaging Studies: US Renal  Result Date: 06/05/2020 CLINICAL DATA:  Right-sided flank pain EXAM: RENAL / URINARY TRACT ULTRASOUND COMPLETE COMPARISON:  None. FINDINGS: Right Kidney: Renal measurements: 12.0 x 6.6 x 7.2 cm = volume: 290 mL . Echogenicity within normal limits. Moderate hydronephrosis is noted. No shadowing stones are seen. Left Kidney: Renal measurements: Somewhat difficult to assess. 8.4 x 4.0 x 4.6 cm = volume: 81 mL. No definite hydronephrosis seen. Bladder: Appears normal for degree of bladder distention. Bilateral ureteral jets are visualized. Other: None. IMPRESSION: Moderate right-sided hydronephrosis.  No shadowing stones. Bilateral ureteral jets are noted. Electronically Signed   By: Prudencio Pair M.D.   On: 06/05/2020 02:52     Assessment and Plan: 19 year old G2P1001 Pyleonephritis  -Admit to Antepartum  per consult with Dr.P Constant who will also enter admission orders. -Provider informs patient of impending admission to include pain management and IV  therapy.  Maryann Conners MSN, CNM Advanced Practice Provider, [facility]

## 2020-06-05 NOTE — MAU Note (Signed)
Pt reports abd pain and back pain that started about 1 1/2 hours ago. Denies bleeding, denies ROM. Reports good fetal movement

## 2020-06-06 ENCOUNTER — Other Ambulatory Visit: Payer: Medicaid Other

## 2020-06-06 DIAGNOSIS — B9620 Unspecified Escherichia coli [E. coli] as the cause of diseases classified elsewhere: Secondary | ICD-10-CM | POA: Diagnosis not present

## 2020-06-06 DIAGNOSIS — Z3A27 27 weeks gestation of pregnancy: Secondary | ICD-10-CM | POA: Diagnosis not present

## 2020-06-06 DIAGNOSIS — O2302 Infections of kidney in pregnancy, second trimester: Secondary | ICD-10-CM | POA: Diagnosis not present

## 2020-06-06 LAB — GC/CHLAMYDIA PROBE AMP (~~LOC~~) NOT AT ARMC
Chlamydia: NEGATIVE
Comment: NEGATIVE
Comment: NORMAL
Neisseria Gonorrhea: NEGATIVE

## 2020-06-06 NOTE — Progress Notes (Signed)
FACULTY PRACTICE ANTEPARTUM COMPREHENSIVE PROGRESS NOTE  Lindsay Blackwell is a 19 y.o. G2P1001 at [redacted]w[redacted]d who is admitted for pyelonephritis.  Estimated Date of Delivery: 09/02/20  Length of Stay:  1 Days. Admitted 06/05/2020  Subjective: Reports feeling much better.  Still has mild bilateral back/flank pain. Patient reports good fetal movement.  She reports no uterine contractions, no bleeding and no loss of fluid per vagina.  Vitals:  Blood pressure (!) 91/51, pulse 86, temperature 98.2 F (36.8 C), temperature source Oral, resp. rate 17, height 5\' 3"  (1.6 m), weight 64 kg, last menstrual period 11/27/2019, SpO2 96 %, currently breastfeeding. Physical Examination: CONSTITUTIONAL: Well-developed, well-nourished female in no acute distress.  HENT:  Normocephalic, atraumatic, External right and left ear normal. Oropharynx is clear and moist EYES: Conjunctivae and EOM are normal. Pupils are equal, round, and reactive to light. No scleral icterus.  NECK: Normal range of motion, supple, no masses SKIN: Skin is warm and dry. No rash noted. Not diaphoretic. No erythema. No pallor. [place]: Alert and oriented to person, place, and time. Normal reflexes, muscle tone coordination. No cranial nerve deficit noted. PSYCHIATRIC: Normal mood and affect. Normal behavior. Normal judgment and thought content. CARDIOVASCULAR: Normal heart rate noted, regular rhythm RESPIRATORY: Effort and breath sounds normal, no problems with respiration noted MUSCULOSKELETAL: Normal range of motion. No edema and no tenderness. 2+ distal pulses. BACK: Bilateral mild CVAT tenderness ABDOMEN: Soft, nontender, nondistended, gravid. CERVIX:  Deferred  Fetal monitoring: FHR: 140 bpm, Variability: moderate, Accelerations: Present, Decelerations: Absent  Uterine activity: No contractions   Results for orders placed or performed during the hospital encounter of 06/05/20 (from the past 48 hour(s))  Urinalysis, Routine w  reflex microscopic     Status: Abnormal   Collection Time: 06/05/20 12:31 AM  Result Value Ref Range   Color, Urine YELLOW YELLOW   APPearance CLOUDY (A) CLEAR   Specific Gravity, Urine 1.019 1.005 - 1.030   pH 6.0 5.0 - 8.0   Glucose, UA NEGATIVE NEGATIVE mg/dL   Hgb urine dipstick SMALL (A) NEGATIVE   Bilirubin Urine NEGATIVE NEGATIVE   Ketones, ur NEGATIVE NEGATIVE mg/dL   Protein, ur 100 (A) NEGATIVE mg/dL   Nitrite NEGATIVE NEGATIVE   Leukocytes,Ua LARGE (A) NEGATIVE   RBC / HPF 21-50 0 - 5 RBC/hpf   WBC, UA >50 (H) 0 - 5 WBC/hpf   Bacteria, UA MANY (A) NONE SEEN   Mucus PRESENT     Comment: Performed at [facility], [address] [place], [place] [address]  Wet prep, genital     Status: Abnormal   Collection Time: 06/05/20 12:45 AM  Result Value Ref Range   Yeast Wet Prep HPF POC NONE SEEN NONE SEEN   Trich, Wet Prep NONE SEEN NONE SEEN   Clue Cells Wet Prep HPF POC NONE SEEN NONE SEEN   WBC, Wet Prep HPF POC MANY (A) NONE SEEN   Sperm NONE SEEN     Comment: Performed at [facility], [place] [address] [place], [place] [address]  CBC with Differential/Platelet     Status: Abnormal   Collection Time: 06/05/20  1:40 AM  Result Value Ref Range   WBC 13.7 (H) 4.0 - 10.5 K/uL   RBC 3.61 (L) 3.87 - 5.11 MIL/uL   Hemoglobin 10.4 (L) 12.0 - 15.0 g/dL   HCT 31.7 (L) 36 - 46 %   MCV 87.8 80.0 - 100.0 fL   MCH 28.8 26.0 - 34.0 pg  MCHC 32.8 30.0 - 36.0 g/dL   RDW 12.8 11.5 - 15.5 %   Platelets 195 150 - 400 K/uL   nRBC 0.0 0.0 - 0.2 %   Neutrophils Relative % 78 %   Neutro Abs 10.6 (H) 1.7 - 7.7 K/uL   Lymphocytes Relative 15 %   Lymphs Abs 2.0 0.7 - 4.0 K/uL   Monocytes Relative 6 %   Monocytes Absolute 0.9 0 - 1 K/uL   Eosinophils Relative 0 %   Eosinophils Absolute 0.0 0 - 0 K/uL   Basophils Relative 0 %   Basophils Absolute 0.0 0 - 0 K/uL   Immature Granulocytes 1 %   Abs Immature Granulocytes 0.11 (H) 0.00 - 0.07 K/uL    Comment: Performed at [facility] [address], [place], [place] [address]  Comprehensive metabolic panel     Status: Abnormal   Collection Time: 06/05/20  1:40 AM  Result Value Ref Range   Sodium 135 135 - 145 mmol/L   Potassium 3.7 3.5 - 5.1 mmol/L   Chloride 103 98 - 111 mmol/L   CO2 24 22 - 32 mmol/L   Glucose, Bld 103 (H) 70 - 99 mg/dL    Comment: Glucose reference range applies only to samples taken after fasting for at least 8 hours.   BUN 13 6 - 20 mg/dL   Creatinine, Ser 0.78 0.44 - 1.00 mg/dL   Calcium 8.4 (L) 8.9 - 10.3 mg/dL   Total Protein 6.2 (L) 6.5 - 8.1 g/dL   Albumin 2.9 (L) 3.5 - 5.0 g/dL   AST 15 15 - 41 U/L   ALT 7 0 - 44 U/L   Alkaline Phosphatase 84 38 - 126 U/L   Total Bilirubin 0.4 0.3 - 1.2 mg/dL   GFR calc non Af Amer >60 >60 mL/min   GFR calc Af Amer >60 >60 mL/min   Anion gap 8 5 - 15    Comment: Performed at [facility] [address], [place], [place] [address]  SARS Coronavirus 2 by RT PCR (hospital order, performed in [facility] hospital lab) Nasopharyngeal Nasopharyngeal Swab     Status: None   Collection Time: 06/05/20  4:05 AM   Specimen: Nasopharyngeal Swab  Result Value Ref Range   SARS Coronavirus 2 NEGATIVE NEGATIVE    Comment: (NOTE) SARS-CoV-2 target nucleic acids are NOT DETECTED.  The SARS-CoV-2 RNA is generally detectable in upper and lower respiratory specimens during the acute phase of infection. The lowest concentration of SARS-CoV-2 viral copies this assay can detect is 250 copies / mL. A negative result does not preclude SARS-CoV-2 infection and should not be used as the sole basis for treatment or other patient management decisions.  A negative result may occur with improper specimen collection / handling, submission of specimen other than nasopharyngeal swab, presence of viral mutation(s) within the areas targeted by this assay, and inadequate number of viral copies (<250 copies / mL). A negative result must be combined with  clinical observations, patient history, and epidemiological information.  Fact Sheet for Patients:   StrictlyIdeas.no  Fact Sheet for Healthcare Providers: BankingDealers.co.za  This test is not yet approved or  cleared by the [place] FDA and has been authorized for detection and/or diagnosis of SARS-CoV-2 by FDA under an Emergency Use Authorization (EUA).  This EUA will remain in effect (meaning this test can be used) for the duration of the COVID-19 declaration under Section 564(b)(1) of the Act, 21 U.S.C.  section 360bbb-3(b)(1), unless the authorization is terminated or revoked sooner.  Performed at [facility], [place] [address], [place], [place] [address]   Type and screen [place]     Status: None   Collection Time: 06/05/20  4:45 AM  Result Value Ref Range   ABO/RH(D) O POS    Antibody Screen NEG    Sample Expiration      06/08/2020,2359 Performed at [facility], [place] [address], [place], [place] [address]   CBC     Status: Abnormal   Collection Time: 06/05/20  4:45 AM  Result Value Ref Range   WBC 12.1 (H) 4.0 - 10.5 K/uL   RBC 3.53 (L) 3.87 - 5.11 MIL/uL   Hemoglobin 10.0 (L) 12.0 - 15.0 g/dL   HCT 30.6 (L) 36 - 46 %   MCV 86.7 80.0 - 100.0 fL   MCH 28.3 26.0 - 34.0 pg   MCHC 32.7 30.0 - 36.0 g/dL   RDW 12.9 11.5 - 15.5 %   Platelets 175 150 - 400 K/uL   nRBC 0.0 0.0 - 0.2 %    Comment: Performed at [facility], [place] [address] [place], [place] [address]  RPR     Status: None   Collection Time: 06/05/20  4:45 AM  Result Value Ref Range   RPR Ser Ql NON REACTIVE NON REACTIVE    Comment: Performed at [facility], [place] [address], [place], [place] [address]    US Renal  Result Date: 06/05/2020 CLINICAL DATA:  Right-sided flank pain EXAM: RENAL / URINARY TRACT ULTRASOUND COMPLETE COMPARISON:  None. FINDINGS: Right Kidney: Renal measurements: 12.0 x 6.6 x 7.2 cm =  volume: 290 mL . Echogenicity within normal limits. Moderate hydronephrosis is noted. No shadowing stones are seen. Left Kidney: Renal measurements: Somewhat difficult to assess. 8.4 x 4.0 x 4.6 cm = volume: 81 mL. No definite hydronephrosis seen. Bladder: Appears normal for degree of bladder distention. Bilateral ureteral jets are visualized. Other: None. IMPRESSION: Moderate right-sided hydronephrosis.  No shadowing stones. Bilateral ureteral jets are noted. Electronically Signed   By: Prudencio Pair M.D.   On: 06/05/2020 02:52    Current scheduled medications . docusate sodium  100 mg Oral Daily  . enoxaparin (LOVENOX) injection  40 mg Subcutaneous Q24H  . prenatal multivitamin  1 tablet Oral Q1200    I have reviewed the patient's current medications.  ASSESSMENT: Active Problems:   Pyelonephritis affecting pregnancy in third trimester   PLAN: Continue Rocephin for 24 more hours, discharge tomorrow on oral regimen and suppression for rest of pregnancy. Category 1 FHR tracing Continue routine antenatal care.   Verita Schneiders, MD, [place] for [facility], [place]

## 2020-06-07 DIAGNOSIS — O2302 Infections of kidney in pregnancy, second trimester: Principal | ICD-10-CM

## 2020-06-07 DIAGNOSIS — B9620 Unspecified Escherichia coli [E. coli] as the cause of diseases classified elsewhere: Secondary | ICD-10-CM

## 2020-06-07 DIAGNOSIS — Z3A27 27 weeks gestation of pregnancy: Secondary | ICD-10-CM

## 2020-06-07 LAB — URINE CULTURE: Culture: >=100000 — AB

## 2020-06-07 MED ORDER — NITROFURANTOIN MONOHYD MACRO 100 MG PO CAPS
100.0000 mg | ORAL_CAPSULE | Freq: Every day | ORAL | 0 refills | Status: DC
Start: 2020-06-07 — End: 2020-09-02

## 2020-06-07 MED ORDER — SULFAMETHOXAZOLE-TRIMETHOPRIM 800-160 MG PO TABS: 1.0000 | ORAL_TABLET | Freq: Two times a day (BID) | ORAL | 0 refills | Status: AC

## 2020-06-07 NOTE — Discharge Summary (Signed)
Antenatal Physician Discharge Summary  Patient ID: Lindsay Blackwell MRN: [id number] DOB/AGE: 2001/03/14 19 y.o.  Admit date: 06/05/2020 Discharge date: 06/07/2020  Admission Diagnoses: Active Problems:   Pyelonephritis affecting pregnancy in third trimester  Discharge Diagnoses: Same  Prenatal Procedures: NST, renal ultrasound  Consults: None  Hospital Course:  Lindsay Blackwell is a 19 y.o. G2P1001 with IUP at [redacted]w[redacted]d admitted for pyelonephritis.  Patient has long history of urinary tract infections this pregnancy.  She arrived with severe CVA tenderness and elevated white count.  She was admitted started on Rocephin.  She remained afebrile throughout her course and was kept for approximately 48 hours.  She reported improvement in her symptoms.  Her urine culture grew pansensitive E. coli.  She was deemed stable for discharge to home with outpatient follow up.  She is discharged home on 10 days of Bactrim double strength twice daily, followed by Macrobid 100 mg nightly for the remainder of her pregnancy.  Discharge Exam: Temp:  [97.6 F (36.4 C)-98.1 F (36.7 C)] 98.1 F (36.7 C) (08/03 0904) Pulse Rate:  [86-95] 86 (08/03 0904) Resp:  [16-18] 16 (08/03 0904) BP: (110-121)/(62-69) 110/66 (08/03 0904) SpO2:  [98 %-100 %] 100 % (08/03 0904) Physical Examination: CONSTITUTIONAL: Well-developed, well-nourished female in no acute distress.  HENT:  Normocephalic, atraumatic, External right and left ear normal. Oropharynx is clear and moist EYES: Conjunctivae and EOM are normal.  No scleral icterus.  NECK: Normal range of motion, supple, no masses SKIN: Skin is warm and dry. No rash noted. Not diaphoretic NEUROLGIC: Alert and oriented to person, place, and time. Normal muscle tone coordination. No cranial nerve deficit noted. PSYCHIATRIC: Normal mood and affect. Normal behavior. Normal judgment and thought content. CARDIOVASCULAR: Normal heart rate noted, regular rhythm RESPIRATORY:  Effort and breath sounds normal, no problems with respiration noted MUSCULOSKELETAL: Normal range of motion. No edema and no tenderness.  No CVA tenderness. ABDOMEN: Soft, nontender, nondistended, gravid.  Fetal monitoring: FHR: 135 bpm, Variability: moderate, Accelerations: Present, Decelerations: Absent  Uterine activity: No contractions  Significant Diagnostic Studies:  Results for orders placed or performed during the hospital encounter of 06/05/20 (from the past 168 hour(s))  Urine Culture   Collection Time: 06/05/20 12:31 AM   Specimen: Urine, Random  Result Value Ref Range   Specimen Description URINE, RANDOM    Special Requests      NONE Performed at [facility], [address] [place],  [address]    Culture >=100,000 COLONIES/mL ESCHERICHIA COLI (A)    Report Status 06/07/2020 FINAL    Organism ID, Bacteria ESCHERICHIA COLI (A)       Susceptibility   Escherichia coli - MIC*    AMPICILLIN <=2 SENSITIVE Sensitive     CEFAZOLIN <=4 SENSITIVE Sensitive     CEFTRIAXONE <=0.25 SENSITIVE Sensitive     CIPROFLOXACIN <=0.25 SENSITIVE Sensitive     GENTAMICIN <=1 SENSITIVE Sensitive     IMIPENEM <=0.25 SENSITIVE Sensitive     NITROFURANTOIN <=16 SENSITIVE Sensitive     TRIMETH/SULFA <=20 SENSITIVE Sensitive     AMPICILLIN/SULBACTAM <=2 SENSITIVE Sensitive     PIP/TAZO <=4 SENSITIVE Sensitive     * >=100,000 COLONIES/mL ESCHERICHIA COLI  Urinalysis, Routine w reflex microscopic   Collection Time: 06/05/20 12:31 AM  Result Value Ref Range   Color, Urine YELLOW YELLOW   APPearance CLOUDY (A) CLEAR   Specific Gravity, Urine 1.019 1.005 - 1.030   pH 6.0 5.0 - 8.0   Glucose, UA NEGATIVE  NEGATIVE mg/dL   Hgb urine dipstick SMALL (A) NEGATIVE   Bilirubin Urine NEGATIVE NEGATIVE   Ketones, ur NEGATIVE NEGATIVE mg/dL   Protein, ur 100 (A) NEGATIVE mg/dL   Nitrite NEGATIVE NEGATIVE   Leukocytes,Ua LARGE (A) NEGATIVE   RBC / HPF 21-50 0 - 5 RBC/hpf   WBC, UA >50 (H)  0 - 5 WBC/hpf   Bacteria, UA MANY (A) NONE SEEN   Mucus PRESENT   Wet prep, genital   Collection Time: 06/05/20 12:45 AM  Result Value Ref Range   Yeast Wet Prep HPF POC NONE SEEN NONE SEEN   Trich, Wet Prep NONE SEEN NONE SEEN   Clue Cells Wet Prep HPF POC NONE SEEN NONE SEEN   WBC, Wet Prep HPF POC MANY (A) NONE SEEN   Sperm NONE SEEN   GC/Chlamydia probe amp ([place])not at [facility]   Collection Time: 06/05/20  1:17 AM  Result Value Ref Range   Neisseria Gonorrhea Negative    Chlamydia Negative    Comment Normal Reference [place] Chlamydia - Negative    Comment      Normal Reference Range Neisseria Gonorrhea - Negative  CBC with Differential/Platelet   Collection Time: 06/05/20  1:40 AM  Result Value Ref Range   WBC 13.7 (H) 4.0 - 10.5 K/uL   RBC 3.61 (L) 3.87 - 5.11 MIL/uL   Hemoglobin 10.4 (L) 12.0 - 15.0 g/dL   HCT 31.7 (L) 36 - 46 %   MCV 87.8 80.0 - 100.0 fL   MCH 28.8 26.0 - 34.0 pg   MCHC 32.8 30.0 - 36.0 g/dL   RDW 12.8 11.5 - 15.5 %   Platelets 195 150 - 400 K/uL   nRBC 0.0 0.0 - 0.2 %   Neutrophils Relative % 78 %   Neutro Abs 10.6 (H) 1.7 - 7.7 K/uL   Lymphocytes Relative 15 %   Lymphs Abs 2.0 0.7 - 4.0 K/uL   Monocytes Relative 6 %   Monocytes Absolute 0.9 0 - 1 K/uL   Eosinophils Relative 0 %   Eosinophils Absolute 0.0 0 - 0 K/uL   Basophils Relative 0 %   Basophils Absolute 0.0 0 - 0 K/uL   Immature Granulocytes 1 %   Abs Immature Granulocytes 0.11 (H) 0.00 - 0.07 K/uL  Comprehensive metabolic panel   Collection Time: 06/05/20  1:40 AM  Result Value Ref Range   Sodium 135 135 - 145 mmol/L   Potassium 3.7 3.5 - 5.1 mmol/L   Chloride 103 98 - 111 mmol/L   CO2 24 22 - 32 mmol/L   Glucose, Bld 103 (H) 70 - 99 mg/dL   BUN 13 6 - 20 mg/dL   Creatinine, Ser 0.78 0.44 - 1.00 mg/dL   Calcium 8.4 (L) 8.9 - 10.3 mg/dL   Total Protein 6.2 (L) 6.5 - 8.1 g/dL   Albumin 2.9 (L) 3.5 - 5.0 g/dL   AST 15 15 - 41 U/L   ALT 7 0 - 44 U/L   Alkaline Phosphatase 84  38 - 126 U/L   Total Bilirubin 0.4 0.3 - 1.2 mg/dL   GFR calc non Af Amer >60 >60 mL/min   GFR calc Af Amer >60 >60 mL/min   Anion gap 8 5 - 15  SARS Coronavirus 2 by RT PCR (hospital order, performed in [place] hospital lab) Nasopharyngeal Nasopharyngeal Swab   Collection Time: 06/05/20  4:05 AM   Specimen: Nasopharyngeal Swab  Result Value Ref Range   SARS Coronavirus 2  NEGATIVE NEGATIVE  CBC   Collection Time: 06/05/20  4:45 AM  Result Value Ref Range   WBC 12.1 (H) 4.0 - 10.5 K/uL   RBC 3.53 (L) 3.87 - 5.11 MIL/uL   Hemoglobin 10.0 (L) 12.0 - 15.0 g/dL   HCT 30.6 (L) 36 - 46 %   MCV 86.7 80.0 - 100.0 fL   MCH 28.3 26.0 - 34.0 pg   MCHC 32.7 30.0 - 36.0 g/dL   RDW 12.9 11.5 - 15.5 %   Platelets 175 150 - 400 K/uL   nRBC 0.0 0.0 - 0.2 %  RPR   Collection Time: 06/05/20  4:45 AM  Result Value Ref Range   RPR Ser Ql NON REACTIVE NON REACTIVE  Type and screen [place]   Collection Time: 06/05/20  4:45 AM  Result Value Ref Range   ABO/RH(D) O POS    Antibody Screen NEG    Sample Expiration      06/08/2020,2359 Performed at [facility], [place] [address] [place], [place] [address]    US Renal  Result Date: 06/05/2020 CLINICAL DATA:  Right-sided flank pain EXAM: RENAL / URINARY TRACT ULTRASOUND COMPLETE COMPARISON:  None. FINDINGS: Right Kidney: Renal measurements: 12.0 x 6.6 x 7.2 cm = volume: 290 mL . Echogenicity within normal limits. Moderate hydronephrosis is noted. No shadowing stones are seen. Left Kidney: Renal measurements: Somewhat difficult to assess. 8.4 x 4.0 x 4.6 cm = volume: 81 mL. No definite hydronephrosis seen. Bladder: Appears normal for degree of bladder distention. Bilateral ureteral jets are visualized. Other: None. IMPRESSION: Moderate right-sided hydronephrosis.  No shadowing stones. Bilateral ureteral jets are noted. Electronically Signed   By: Prudencio Pair M.D.   On: 06/05/2020 02:52    Future Appointments  Date Time  Provider [place]  06/08/2020  8:50 AM [facility]  06/22/2020  2:10 PM Myrtis Ser, CNM [facility]    Discharge Condition: Stable  Discharge disposition: 01-Home or Self Care       Discharge Instructions    Discharge activity:  No Restrictions   Complete by: As directed    Discharge diet:  No restrictions   Complete by: As directed    Fetal Kick Count:  Lie on our left side for one hour after a meal, and count the number of times your baby kicks.  If it is less than 5 times, get up, move around and drink some juice.  Repeat the test 30 minutes later.  If it is still less than 5 kicks in an hour, notify your doctor.   Complete by: As directed    No sexual activity restrictions   Complete by: As directed    Notify physician for a general feeling that "something is not right"   Complete by: As directed    Notify physician for increase or change in vaginal discharge   Complete by: As directed    Notify physician for intestinal cramps, with or without diarrhea, sometimes described as "gas pain"   Complete by: As directed    Notify physician for leaking of fluid   Complete by: As directed    Notify physician for low, dull backache, unrelieved by heat or Tylenol   Complete by: As directed    Notify physician for menstrual like cramps   Complete by: As directed    Notify physician for pelvic pressure   Complete by: As directed    Notify physician for uterine contractions.  These may  be painless and feel like the uterus is tightening or the baby is  "balling up"   Complete by: As directed    Notify physician for vaginal bleeding   Complete by: As directed    PRETERM LABOR:  Includes any of the follwing symptoms that occur between 20 - [redacted] weeks gestation.  If these symptoms are not stopped, preterm labor can result in preterm delivery, placing your baby at risk   Complete by: As directed      Allergies as of 06/07/2020   No Known Allergies      Medication List    STOP taking these medications   cephALEXin 500 MG capsule Commonly known as: KEFLEX   nitrofurantoin 100 MG capsule Commonly known as: Macrodantin     TAKE these medications   Blood Pressure Monitor Misc For regular home bp monitoring during pregnancy   nitrofurantoin (macrocrystal-monohydrate) 100 MG capsule Commonly known as: Macrobid Take 1 capsule (100 mg total) by mouth at bedtime. Begin once you have completed 10 days of Bactrim   Prenatal Plus Iron 29-1 MG Tabs Take 1 tablet by mouth daily.   promethazine 25 MG tablet Commonly known as: PHENERGAN Take 1 tablet (25 mg total) by mouth every 6 (six) hours as needed for nausea or vomiting.   sulfamethoxazole-trimethoprim 800-160 MG tablet Commonly known as: BACTRIM DS Take 1 tablet by mouth 2 (two) times daily for 10 days.       Follow-up Information    [facility] Follow up.   Contact information: [place] [id number] [phone]              Signed: Donnamae Jude M.D. 06/07/2020, 9:39 AM

## 2020-06-07 NOTE — Plan of Care (Signed)
Pt to be discharged home with printed instructions. Adelina Collard L Alin Chavira, RN  

## 2020-06-07 NOTE — Discharge Instructions (Signed)
Pyelonephritis During Pregnancy ° °What are the causes? °This condition is caused by a bacterial infection in the lower urinary tract that spreads to the kidney. °What increases the risk? °You are more likely to develop this condition if: °· You have diabetes. °· You have a history of frequent urinary tract infections. °What are the signs or symptoms? °Symptoms of this condition may begin with symptoms of a lower urinary tract infection. These may include: °· A frequent urge to pass urine. °· Burning pain when passing urine. °· Pain and pressure in your lower abdomen. °· Blood in your urine. °· Cloudy or smelly urine. °As the infection spreads to your kidney, you may have these symptoms: °· Fever. °· Chills. °· Pain and tenderness in your upper abdomen or in your back and sides (flank pain). Flank pain often affects one side of the body, usually the right side. °· Nausea, vomiting, or loss of appetite. °How is this diagnosed? °This condition may be diagnosed based on: °· Your symptoms and medical history. °· A physical exam. °· Tests to confirm the diagnosis. These may include: °? Blood tests to check kidney function and look for signs of infection. °? Urine tests to check for signs of infection, including bacteria, white or red blood cells, and protein. °? Tests to grow and identify the type of bacteria that is causing the infection (urine culture). °? Imaging studies of your kidneys to learn more about your condition. °How is this treated? °This condition is treated in the hospital with antibiotics that are given into one of your veins through an IV. Your health care provider: °· Will start you on an antibiotic that is effective against common urinary tract infections. °· May switch to another antibiotic if the results of your urine culture show that your infection is caused by different bacteria. °· Will be careful to choose antibiotics that are the safest during pregnancy. °Other treatments may include: °· IV  fluids if you are nauseous and not able to drink fluids. °· Pain and fever medicines. °· Medicines for nausea and vomiting. °You will be able to go home when your infection is under control. To prevent another infection, you may need to continue taking antibiotics by mouth until your baby is born. °Follow these instructions at home: °Medicines ° °· Take over-the-counter and prescription medicines only as told by your health care provider. °· Take your antibiotic medicine as told by your health care provider. Do not stop taking the antibiotic even if you start to feel better. °· Continue to take your prenatal vitamins. °Lifestyle ° °· Follow instructions from your health care provider about eating or drinking restrictions. You may need to avoid: °? Foods and drinks with added sugar. °? Caffeine and fruit juice. °· Drink enough fluid to keep your urine pale yellow. °· Go to the bathroom frequently. Do not hold your urine. Try to empty your bladder completely. °· Change your underwear every day. Wear all-cotton underwear. Do not wear tight underwear or pants. °General instructions ° °· Take these steps to lower the risk of bacteria getting into your urinary tract: °? Use liquid soap instead of bar soap when showering or bathing. Bacteria can grow on bar soap. °? When you wash yourself, clean the urethra opening first. Use a washcloth to clean the area between your vagina and anus. Pat the area dry with a clean towel. °? Wash your hands before and after you go to the bathroom. °? Wipe yourself from front to back   after going to the bathroom. °? Do not use douches, perfumed soap, creams, or powders. °? Do not soak in a bath for more than 30 minutes. °· Return to your normal activities as told by your health care provider. Ask your health care provider what activities are safe for you. °· Keep all follow-up visits as told by your health care provider. This is important. °Contact a health care provider if: °· You have  chills or a fever. °· You have any symptoms of infection that do not get better at home. °· Symptoms of infection come back. °· You have a reaction or side effects from your antibiotic. °Get help right away if: °· You start having contractions. °Summary °· Pyelonephritis is an infection of the kidney or kidneys. °· This condition results when a bacterial infection in the lower urinary tract spreads to the kidney. °· Lower urinary tract infections are common during pregnancy. °· Pyelonephritis causes chills, a fever, flank pain, and nausea. °· Pyelonephritis is a serious infection that is usually treated in the hospital with IV antibiotics. °This information is not intended to replace advice given to you by your health care provider. Make sure you discuss any questions you have with your health care provider. °Document Revised: 02/13/2019 Document Reviewed: 01/23/2018 °Elsevier Patient Education © 2020 [employer] ° °

## 2020-06-08 ENCOUNTER — Other Ambulatory Visit: Payer: Medicaid Other

## 2020-06-08 ENCOUNTER — Telehealth: Payer: Self-pay | Admitting: *Deleted

## 2020-06-08 DIAGNOSIS — Z3480 Encounter for supervision of other normal pregnancy, unspecified trimester: Secondary | ICD-10-CM

## 2020-06-08 DIAGNOSIS — Z3A27 27 weeks gestation of pregnancy: Secondary | ICD-10-CM | POA: Diagnosis not present

## 2020-06-08 NOTE — Telephone Encounter (Signed)
Attempted to contact pt to complete transition of care assessment. Message states the voice mall has not ben set up yet. Unable to leave message.  Lenor Coffin, RN, BSN, [facility] [place] [phone]

## 2020-06-09 ENCOUNTER — Other Ambulatory Visit: Payer: Self-pay | Admitting: Women's Health

## 2020-06-09 LAB — GLUCOSE TOLERANCE, 2 HOURS W/ 1HR
Glucose, 1 hour: 116 mg/dL (ref 65–179)
Glucose, 2 hour: 105 mg/dL (ref 65–152)
Glucose, Fasting: 71 mg/dL (ref 65–91)

## 2020-06-09 LAB — CBC
Hematocrit: 30.5 % — ABNORMAL LOW (ref 34.0–46.6)
Hemoglobin: 10.4 g/dL — ABNORMAL LOW (ref 11.1–15.9)
MCH: 28.9 pg (ref 26.6–33.0)
MCHC: 34.1 g/dL (ref 31.5–35.7)
MCV: 85 fL (ref 79–97)
Platelets: 176 10*3/uL (ref 150–450)
RBC: 3.6 x10E6/uL — ABNORMAL LOW (ref 3.77–5.28)
RDW: 12.3 % (ref 11.7–15.4)
WBC: 7.4 10*3/uL (ref 3.4–10.8)

## 2020-06-09 LAB — RPR: RPR Ser Ql: NONREACTIVE

## 2020-06-09 LAB — HIV ANTIBODY (ROUTINE TESTING W REFLEX): HIV Screen 4th Generation wRfx: NONREACTIVE

## 2020-06-09 LAB — ANTIBODY SCREEN: Antibody Screen: NEGATIVE

## 2020-06-09 MED ORDER — FERROUS SULFATE 325 (65 FE) MG PO TABS: 325.0000 mg | ORAL_TABLET | Freq: Two times a day (BID) | ORAL | 3 refills | Status: DC

## 2020-06-22 ENCOUNTER — Encounter: Payer: Self-pay | Admitting: Advanced Practice Midwife

## 2020-06-22 ENCOUNTER — Other Ambulatory Visit: Payer: Self-pay

## 2020-06-22 ENCOUNTER — Ambulatory Visit (INDEPENDENT_AMBULATORY_CARE_PROVIDER_SITE_OTHER): Payer: Medicaid Other | Admitting: Advanced Practice Midwife

## 2020-06-22 VITALS — BP 110/67 | HR 92 | Wt 146.0 lb

## 2020-06-22 DIAGNOSIS — Z1389 Encounter for screening for other disorder: Secondary | ICD-10-CM | POA: Diagnosis not present

## 2020-06-22 DIAGNOSIS — Z331 Pregnant state, incidental: Secondary | ICD-10-CM | POA: Diagnosis not present

## 2020-06-22 DIAGNOSIS — Z3480 Encounter for supervision of other normal pregnancy, unspecified trimester: Secondary | ICD-10-CM | POA: Diagnosis not present

## 2020-06-22 DIAGNOSIS — O2341 Unspecified infection of urinary tract in pregnancy, first trimester: Secondary | ICD-10-CM

## 2020-06-22 DIAGNOSIS — Z23 Encounter for immunization: Secondary | ICD-10-CM

## 2020-06-22 DIAGNOSIS — Z3A29 29 weeks gestation of pregnancy: Secondary | ICD-10-CM

## 2020-06-22 LAB — POCT URINALYSIS DIPSTICK OB
Blood, UA: NEGATIVE
Glucose, UA: NEGATIVE
Ketones, UA: NEGATIVE
Leukocytes, UA: NEGATIVE
Nitrite, UA: NEGATIVE
POC,PROTEIN,UA: NEGATIVE

## 2020-06-22 NOTE — Progress Notes (Signed)
   LOW-RISK PREGNANCY VISIT Patient name: Lindsay Blackwell MRN [id number]  Date of birth: 2001/01/08 Chief Complaint:   Routine Prenatal Visit  History of Present Illness:   Lindsay Blackwell is a 19 y.o. G36P1001 female at [redacted]w[redacted]d with an Estimated Date of Delivery: 09/02/20 being seen today for ongoing management of a low-risk pregnancy.  Today she reports hospitalized at 27wks for pyelo, now on suppression nightly and doing well; has occ back & rib discomfort. Contractions: Not present. Vag. Bleeding: None.  Movement: Present. denies leaking of fluid. Review of Systems:   Pertinent items are noted in HPI Denies abnormal vaginal discharge w/ itching/odor/irritation, headaches, visual changes, shortness of breath, chest pain, abdominal pain, severe nausea/vomiting, or problems with urination or bowel movements unless otherwise stated above. Pertinent History Reviewed:  Reviewed past medical,surgical, social, obstetrical and family history.  Reviewed problem list, medications and allergies. Physical Assessment:   Vitals:   06/22/20 1425  BP: 110/67  Pulse: 92  Weight: 146 lb (66.2 kg)  Body mass index is 25.86 kg/m.        Physical Examination:   General appearance: Well appearing, and in no distress  Mental status: Alert, oriented to person, place, and time  Skin: Warm & dry  Cardiovascular: Normal heart rate noted  Respiratory: Normal respiratory effort, no distress  Abdomen: Soft, gravid, nontender  Pelvic: Cervical exam deferred         Extremities:    Fetal Status: Fetal Heart Rate (bpm): 145 Fundal Height: 29 cm Movement: Present    Results for orders placed or performed in visit on 06/22/20 (from the past 24 hour(s))  POC Urinalysis Dipstick OB   Collection Time: 06/22/20  2:30 PM  Result Value Ref Range   Color, UA     Clarity, UA     Glucose, UA Negative Negative   Bilirubin, UA     Ketones, UA neg    Spec Grav, UA     Blood, UA neg    pH, UA      POC,PROTEIN,UA Negative Negative, Trace, Small (1+), Moderate (2+), Large (3+), 4+   Urobilinogen, UA     Nitrite, UA neg    Leukocytes, UA Negative Negative   Appearance     Odor      Assessment & Plan:  1) Low-risk pregnancy G2P1001 at [redacted]w[redacted]d with an Estimated Date of Delivery: 09/02/20   2) Pyelo at 27wks, nightly suppression now with POC today   Meds: No orders of the defined types were placed in this encounter.  Labs/procedures today: urine culture & Tdap  Plan:  Continue routine obstetrical care   Reviewed: Preterm labor symptoms and general obstetric precautions including but not limited to vaginal bleeding, contractions, leaking of fluid and fetal movement were reviewed in detail with the patient.  All questions were answered. Has home bp cuff. Check bp weekly, let us know if >140/90.   Follow-up: Return in about 2 weeks (around 07/06/2020) for [place], in person.  Orders Placed This Encounter  Procedures  . Urine Culture  . Tdap vaccine greater than or equal to 7yo IM  . POC Urinalysis Dipstick OB   Myrtis Ser [facility] 06/22/2020 3:02 PM

## 2020-06-22 NOTE — Patient Instructions (Signed)
Lindsay Blackwell, I greatly value your feedback.  If you receive a survey following your visit with [place] today, we appreciate you taking the time to fill it out.  Thanks, Lindsay Blackwell, [place] at [facility] ([place], [place] [address]) Entrance C, located off of [address] [place] parking  Go to [email or web address] to register for FREE online childbirth classes   Call the office [phone]) or go to [facility] if:  You begin to have strong, frequent contractions  Your water breaks.  Sometimes it is a big gush of fluid, sometimes it is just a trickle that keeps getting your panties wet or running down your legs  You have vaginal bleeding.  It is normal to have a small amount of spotting if your cervix was checked.   You don't feel your baby moving like normal.  If you don't, get you something to eat and drink and lay down and focus on feeling your baby move.  You should feel at least 10 movements in 2 hours.  If you don't, you should call the office or go to [facility].    Tdap Vaccine  It is recommended that you get the Tdap vaccine during the third trimester of EACH pregnancy to help protect your baby from getting pertussis (whooping cough)  27-36 weeks is the BEST time to do this so that you can pass the protection on to your baby. During pregnancy is better than after pregnancy, but if you are unable to get it during pregnancy it will be offered at the hospital.   You can get this vaccine with [place], at the health department, your family doctor, or some local pharmacies  Everyone who will be around your baby should also be up-to-date on their vaccines before the baby comes. Adults (who are not pregnant) only need 1 dose of Tdap during adulthood.   [place] Pediatricians/Family Doctors:  [facility] [phone]                 [place]  [phone] (usually not accepting new patients unless you have family there already, you are always welcome to call and ask)       [facility] Department [phone]       [facility] Pediatricians/Family Doctors:   [facility]: [phone]  [facility]: [phone]  [facility]: [place] Doctors:   [facility]: [facility]: [place]:  [place]: [phone]   Home Blood Pressure Monitoring for Patients   Your provider has recommended that you check your blood pressure (BP) at least once a week at home. If you do not have a blood pressure cuff at home, one will be provided for you. Contact your provider if you have not received your monitor within 1 week.   Helpful Tips for Accurate Home Blood Pressure Checks  . Don't smoke, exercise, or drink caffeine 30 minutes before checking your BP . Use the restroom before checking your BP (a full bladder can raise your pressure) . Relax in a comfortable upright chair . Feet on the ground . Left arm resting comfortably on a flat surface at the level of your heart . Legs uncrossed . Back supported . Sit quietly and don't talk . Place the cuff on your bare  arm . Adjust snuggly, so that only two fingertips can fit between your skin and the top of the cuff . Check 2 readings separated by at least one minute . Keep a log of your BP readings . For a visual, please reference this diagram: http://ccnc.care/bpdiagram  Provider Name: [facility]     Phone: [phone]  Zone 1: ALL CLEAR  Continue to monitor your symptoms:  . BP reading is less than 140 (top number) or less than 90 (bottom number)  . No right upper stomach pain . No headaches or seeing spots . No feeling nauseated or throwing up . No swelling in face and hands  Zone 2: CAUTION Call your  doctor's office for any of the following:  . BP reading is greater than 140 (top number) or greater than 90 (bottom number)  . Stomach pain under your ribs in the middle or right side . Headaches or seeing spots . Feeling nauseated or throwing up . Swelling in face and hands  Zone 3: EMERGENCY  Seek immediate medical care if you have any of the following:  . BP reading is greater than160 (top number) or greater than 110 (bottom number) . Severe headaches not improving with Tylenol . Serious difficulty catching your breath . Any worsening symptoms from Zone 2   Third Trimester of Pregnancy The third trimester is from week 29 through week 42, months 7 through 9. The third trimester is a time when the fetus is growing rapidly. At the end of the ninth month, the fetus is about 20 inches in length and weighs 6-10 pounds.  BODY CHANGES Your body goes through many changes during pregnancy. The changes vary from woman to woman.   Your weight will continue to increase. You can expect to gain 25-35 pounds (11-16 kg) by the end of the pregnancy.  You may begin to get stretch marks on your hips, abdomen, and breasts.  You may urinate more often because the fetus is moving lower into your pelvis and pressing on your bladder.  You may develop or continue to have heartburn as a result of your pregnancy.  You may develop constipation because certain hormones are causing the muscles that push waste through your intestines to slow down.  You may develop hemorrhoids or swollen, bulging veins (varicose veins).  You may have pelvic pain because of the weight gain and pregnancy hormones relaxing your joints between the bones in your pelvis. Backaches may result from overexertion of the muscles supporting your posture.  You may have changes in your hair. These can include thickening of your hair, rapid growth, and changes in texture. Some women also have hair loss during or after pregnancy, or hair that  feels dry or thin. Your hair will most likely return to normal after your baby is born.  Your breasts will continue to grow and be tender. A yellow discharge may leak from your breasts called colostrum.  Your belly button may stick out.  You may feel short of breath because of your expanding uterus.  You may notice the fetus "dropping," or moving lower in your abdomen.  You may have a bloody mucus discharge. This usually occurs a few days to a week before labor begins.  Your cervix becomes thin and soft (effaced) near your due date. WHAT TO EXPECT AT YOUR PRENATAL EXAMS  You will have prenatal exams every 2 weeks until week 36. Then, you will have weekly prenatal exams. During a routine prenatal visit:  You  will be weighed to make sure you and the fetus are growing normally.  Your blood pressure is taken.  Your abdomen will be measured to track your baby's growth.  The fetal heartbeat will be listened to.  Any test results from the previous visit will be discussed.  You may have a cervical check near your due date to see if you have effaced. At around 36 weeks, your caregiver will check your cervix. At the same time, your caregiver will also perform a test on the secretions of the vaginal tissue. This test is to determine if a type of bacteria, Group B streptococcus, is present. Your caregiver will explain this further. Your caregiver may ask you:  What your birth plan is.  How you are feeling.  If you are feeling the baby move.  If you have had any abnormal symptoms, such as leaking fluid, bleeding, severe headaches, or abdominal cramping.  If you have any questions. Other tests or screenings that may be performed during your third trimester include:  Blood tests that check for low iron levels (anemia).  Fetal testing to check the health, activity level, and growth of the fetus. Testing is done if you have certain medical conditions or if there are problems during the  pregnancy. FALSE LABOR You may feel small, irregular contractions that eventually go away. These are called Braxton Hicks contractions, or false labor. Contractions may last for hours, days, or even weeks before true labor sets in. If contractions come at regular intervals, intensify, or become painful, it is best to be seen by your caregiver.  SIGNS OF LABOR   Menstrual-like cramps.  Contractions that are 5 minutes apart or less.  Contractions that start on the top of the uterus and spread down to the lower abdomen and back.  A sense of increased pelvic pressure or back pain.  A watery or bloody mucus discharge that comes from the vagina. If you have any of these signs before the 37th week of pregnancy, call your caregiver right away. You need to go to the hospital to get checked immediately. HOME CARE INSTRUCTIONS   Avoid all smoking, herbs, alcohol, and unprescribed drugs. These chemicals affect the formation and growth of the baby.  Follow your caregiver's instructions regarding medicine use. There are medicines that are either safe or unsafe to take during pregnancy.  Exercise only as directed by your caregiver. Experiencing uterine cramps is a good sign to stop exercising.  Continue to eat regular, healthy meals.  Wear a good support bra for breast tenderness.  Do not use hot tubs, steam rooms, or saunas.  Wear your seat belt at all times when driving.  Avoid raw meat, uncooked cheese, cat litter boxes, and soil used by cats. These carry germs that can cause birth defects in the baby.  Take your prenatal vitamins.  Try taking a stool softener (if your caregiver approves) if you develop constipation. Eat more high-fiber foods, such as fresh vegetables or fruit and whole grains. Drink plenty of fluids to keep your urine clear or pale yellow.  Take warm sitz baths to soothe any pain or discomfort caused by hemorrhoids. Use hemorrhoid cream if your caregiver approves.  If you  develop varicose veins, wear support hose. Elevate your feet for 15 minutes, 3-4 times a day. Limit salt in your diet.  Avoid heavy lifting, wear low heal shoes, and practice good posture.  Rest a lot with your legs elevated if you have leg cramps or low back  pain.  Visit your dentist if you have not gone during your pregnancy. Use a soft toothbrush to brush your teeth and be gentle when you floss.  A sexual relationship may be continued unless your caregiver directs you otherwise.  Do not travel far distances unless it is absolutely necessary and only with the approval of your caregiver.  Take prenatal classes to understand, practice, and ask questions about the labor and delivery.  Make a trial run to the hospital.  Pack your hospital bag.  Prepare the baby's nursery.  Continue to go to all your prenatal visits as directed by your caregiver. SEEK MEDICAL CARE IF:  You are unsure if you are in labor or if your water has broken.  You have dizziness.  You have mild pelvic cramps, pelvic pressure, or nagging pain in your abdominal area.  You have persistent nausea, vomiting, or diarrhea.  You have a bad smelling vaginal discharge.  You have pain with urination. SEEK IMMEDIATE MEDICAL CARE IF:   You have a fever.  You are leaking fluid from your vagina.  You have spotting or bleeding from your vagina.  You have severe abdominal cramping or pain.  You have rapid weight loss or gain.  You have shortness of breath with chest pain.  You notice sudden or extreme swelling of your face, hands, ankles, feet, or legs.  You have not felt your baby move in over an hour.  You have severe headaches that do not go away with medicine.  You have vision changes. Document Released: 10/16/2001 Document Revised: 10/27/2013 Document Reviewed: 12/23/2012 [facility] Patient Information 2015 [place], [place]. This information is not intended to replace advice given to you by your health  care provider. Make sure you discuss any questions you have with your health care provider.

## 2020-06-24 LAB — URINE CULTURE: Organism ID, Bacteria: NO GROWTH

## 2020-07-06 ENCOUNTER — Encounter: Payer: Self-pay | Admitting: Women's Health

## 2020-07-06 ENCOUNTER — Ambulatory Visit (INDEPENDENT_AMBULATORY_CARE_PROVIDER_SITE_OTHER): Payer: Medicaid Other | Admitting: Women's Health

## 2020-07-06 VITALS — BP 108/71 | HR 96 | Wt 147.0 lb

## 2020-07-06 DIAGNOSIS — Z331 Pregnant state, incidental: Secondary | ICD-10-CM | POA: Diagnosis not present

## 2020-07-06 DIAGNOSIS — Z3483 Encounter for supervision of other normal pregnancy, third trimester: Secondary | ICD-10-CM | POA: Diagnosis not present

## 2020-07-06 DIAGNOSIS — Z3480 Encounter for supervision of other normal pregnancy, unspecified trimester: Secondary | ICD-10-CM

## 2020-07-06 DIAGNOSIS — Z1389 Encounter for screening for other disorder: Secondary | ICD-10-CM | POA: Diagnosis not present

## 2020-07-06 DIAGNOSIS — Z3A31 31 weeks gestation of pregnancy: Secondary | ICD-10-CM | POA: Diagnosis not present

## 2020-07-06 DIAGNOSIS — O2343 Unspecified infection of urinary tract in pregnancy, third trimester: Secondary | ICD-10-CM

## 2020-07-06 LAB — POCT URINALYSIS DIPSTICK OB
Blood, UA: NEGATIVE
Glucose, UA: NEGATIVE
Ketones, UA: NEGATIVE
Leukocytes, UA: NEGATIVE
Nitrite, UA: NEGATIVE

## 2020-07-06 NOTE — Progress Notes (Signed)
LOW-RISK PREGNANCY VISIT Patient name: Lindsay Blackwell MRN [id number]  Date of birth: 12-24-2000 Chief Complaint:   Routine Prenatal Visit  History of Present Illness:   Lindsay Blackwell is a 19 y.o. G13P1001 female at [redacted]w[redacted]d with an Estimated Date of Delivery: 09/02/20 being seen today for ongoing management of a low-risk pregnancy.  Depression screen [facility] 2/9 02/24/2020 03/03/2018  Decreased Interest 2 0  Down, Depressed, Hopeless 1 1  PHQ - 2 Score 3 1  Altered sleeping 2 1  Tired, decreased energy 2 1  Change in appetite 2 0  Feeling bad or failure about yourself  1 0  Trouble concentrating 1 0  Moving slowly or fidgety/restless 0 0  Suicidal thoughts 0 0  PHQ-9 Score 11 3  Difficult doing work/chores Somewhat difficult -    Today she reports noticed urine looks darker like it does when she had a uti, no sx, taking macrobid as rx'd for suppression. Contractions: Not present. Vag. Bleeding: None.  Movement: Present. denies leaking of fluid. Review of Systems:   Pertinent items are noted in HPI Denies abnormal vaginal discharge w/ itching/odor/irritation, headaches, visual changes, shortness of breath, chest pain, abdominal pain, severe nausea/vomiting, or problems with urination or bowel movements unless otherwise stated above. Pertinent History Reviewed:  Reviewed past medical,surgical, social, obstetrical and family history.  Reviewed problem list, medications and allergies. Physical Assessment:   Vitals:   07/06/20 0932  BP: 108/71  Pulse: 96  Weight: 147 lb (66.7 kg)  Body mass index is 26.04 kg/m.        Physical Examination:   General appearance: Well appearing, and in no distress  Mental status: Alert, oriented to person, place, and time  Skin: Warm & dry  Cardiovascular: Normal heart rate noted  Respiratory: Normal respiratory effort, no distress  Abdomen: Soft, gravid, nontender  Pelvic: Cervical exam deferred         Extremities: Edema: None  Fetal  Status: Fetal Heart Rate (bpm): 152 Fundal Height: 30 cm Movement: Present    Chaperone: n/a    Results for orders placed or performed in visit on 07/06/20 (from the past 24 hour(s))  POC Urinalysis Dipstick OB   Collection Time: 07/06/20  9:37 AM  Result Value Ref Range   Color, UA     Clarity, UA     Glucose, UA Negative Negative   Bilirubin, UA     Ketones, UA neg    Spec Grav, UA     Blood, UA neg    pH, UA     POC,PROTEIN,UA Small (1+) Negative, Trace, Small (1+), Moderate (2+), Large (3+), 4+   Urobilinogen, UA     Nitrite, UA neg    Leukocytes, UA Negative Negative   Appearance     Odor      Assessment & Plan:  1) Low-risk pregnancy G2P1001 at [redacted]w[redacted]d with an Estimated Date of Delivery: 09/02/20   2) Recurrent UTIs w/ pyelo @ 27wks> on macrobid suppression, concerned about urine color, 1+ protein, will send cx   Meds: No orders of the defined types were placed in this encounter.  Labs/procedures today: urine cx  Plan:  Continue routine obstetrical care  Next visit: prefers in person    Reviewed: Preterm labor symptoms and general obstetric precautions including but not limited to vaginal bleeding, contractions, leaking of fluid and fetal movement were reviewed in detail with the patient.  All questions were answered.   Follow-up: Return in about 2 weeks (around 07/20/2020)  for LROB, in person, CNM.  Orders Placed This Encounter  Procedures  . Urine Culture  . POC Urinalysis Dipstick OB   Roma Schanz CNM, [facility] 07/06/2020 10:18 AM

## 2020-07-06 NOTE — Patient Instructions (Signed)
Henderson Cloud, I greatly value your feedback.  If you receive a survey following your visit with [place] today, we appreciate you taking the time to fill it out.  Thanks, Knute Neu, CNM, WHNP-BC  [facility] [place] at [facility] ([place], [place] [address]) Entrance C, located off of [address] parking   Go to [email or web address] to register for FREE online childbirth classes    Call the office [phone]) or go to [facility] if:  You begin to have strong, frequent contractions  Your water breaks.  Sometimes it is a big gush of fluid, sometimes it is just a trickle that keeps getting your panties wet or running down your legs  You have vaginal bleeding.  It is normal to have a small amount of spotting if your cervix was checked.   You don't feel your baby moving like normal.  If you don't, get you something to eat and drink and lay down and focus on feeling your baby move.  You should feel at least 10 movements in 2 hours.  If you don't, you should call the office or go to [facility].   Call the office [phone]) or go to [facility] hospital for these signs of pre-eclampsia:  Severe headache that does not go away with Tylenol  Visual changes- seeing spots, double, blurred vision  Pain under your right breast or upper abdomen that does not go away with Tums or heartburn medicine  Nausea and/or vomiting  Severe swelling in your hands, feet, and face    Home Blood Pressure Monitoring for Patients   Your provider has recommended that you check your blood pressure (BP) at least once a week at home. If you do not have a blood pressure cuff at home, one will be provided for you. Contact your provider if you have not received your monitor within 1 week.   Helpful Tips for Accurate Home Blood Pressure Checks  . Don't smoke, exercise, or drink caffeine 30 minutes before checking your BP . Use the restroom before checking your BP (a full  bladder can raise your pressure) . Relax in a comfortable upright chair . Feet on the ground . Left arm resting comfortably on a flat surface at the level of your heart . Legs uncrossed . Back supported . Sit quietly and don't talk . Place the cuff on your bare arm . Adjust snuggly, so that only two fingertips can fit between your skin and the top of the cuff . Check 2 readings separated by at least one minute . Keep a log of your BP readings . For a visual, please reference this diagram: http://ccnc.care/bpdiagram  Provider Name: [facility]     Phone: [phone]  Zone 1: ALL CLEAR  Continue to monitor your symptoms:  . BP reading is less than 140 (top number) or less than 90 (bottom number)  . No right upper stomach pain . No headaches or seeing spots . No feeling nauseated or throwing up . No swelling in face and hands  Zone 2: CAUTION Call your doctor's office for any of the following:  . BP reading is greater than 140 (top number) or greater than 90 (bottom number)  . Stomach pain under your ribs in the middle or right side . Headaches or seeing spots . Feeling nauseated or throwing up . Swelling in face and hands  Zone 3: EMERGENCY  Seek immediate medical care if you have any of  the following:  . BP reading is greater than160 (top number) or greater than 110 (bottom number) . Severe headaches not improving with Tylenol . Serious difficulty catching your breath . Any worsening symptoms from Zone 2  Preterm Labor and Birth Information  The normal length of a pregnancy is 39-41 weeks. Preterm labor is when labor starts before 37 completed weeks of pregnancy. What are the risk factors for preterm labor? Preterm labor is more likely to occur in women who:  Have certain infections during pregnancy such as a bladder infection, sexually transmitted infection, or infection inside the uterus (chorioamnionitis).  Have a shorter-than-normal cervix.  Have gone into  preterm labor before.  Have had surgery on their cervix.  Are younger than age 54 or older than age 25.  Are African American.  Are pregnant with twins or multiple babies (multiple gestation).  Take street drugs or smoke while pregnant.  Do not gain enough weight while pregnant.  Became pregnant shortly after having been pregnant. What are the symptoms of preterm labor? Symptoms of preterm labor include:  Cramps similar to those that can happen during a menstrual period. The cramps may happen with diarrhea.  Pain in the abdomen or lower back.  Regular uterine contractions that may feel like tightening of the abdomen.  A feeling of increased pressure in the pelvis.  Increased watery or bloody mucus discharge from the vagina.  Water breaking (ruptured amniotic sac). Why is it important to recognize signs of preterm labor? It is important to recognize signs of preterm labor because babies who are born prematurely may not be fully developed. This can put them at an increased risk for:  Long-term (chronic) heart and lung problems.  Difficulty immediately after birth with regulating body systems, including blood sugar, body temperature, heart rate, and breathing rate.  Bleeding in the brain.  Cerebral palsy.  Learning difficulties.  Death. These risks are highest for babies who are born before 48 weeks of pregnancy. How is preterm labor treated? Treatment depends on the length of your pregnancy, your condition, and the health of your baby. It may involve: 1. Having a stitch (suture) placed in your cervix to prevent your cervix from opening too early (cerclage). 2. Taking or being given medicines, such as: ? Hormone medicines. These may be given early in pregnancy to help support the pregnancy. ? Medicine to stop contractions. ? Medicines to help mature the baby's lungs. These may be prescribed if the risk of delivery is high. ? Medicines to prevent your baby from  developing cerebral palsy. If the labor happens before 34 weeks of pregnancy, you may need to stay in the hospital. What should I do if I think I am in preterm labor? If you think that you are going into preterm labor, call your health care provider right away. How can I prevent preterm labor in future pregnancies? To increase your chance of having a full-term pregnancy:  Do not use any tobacco products, such as cigarettes, chewing tobacco, and e-cigarettes. If you need help quitting, ask your health care provider.  Do not use street drugs or medicines that have not been prescribed to you during your pregnancy.  Talk with your health care provider before taking any herbal supplements, even if you have been taking them regularly.  Make sure you gain a healthy amount of weight during your pregnancy.  Watch for infection. If you think that you might have an infection, get it checked right away.  Make sure to  tell your health care provider if you have gone into preterm labor before. This information is not intended to replace advice given to you by your health care provider. Make sure you discuss any questions you have with your health care provider. Document Revised: 02/13/2019 Document Reviewed: 03/14/2016 Elsevier Patient Education  [place].

## 2020-07-08 LAB — URINE CULTURE

## 2020-07-20 ENCOUNTER — Other Ambulatory Visit: Payer: Self-pay

## 2020-07-20 ENCOUNTER — Ambulatory Visit (INDEPENDENT_AMBULATORY_CARE_PROVIDER_SITE_OTHER): Payer: Medicaid Other | Admitting: Women's Health

## 2020-07-20 ENCOUNTER — Encounter: Payer: Self-pay | Admitting: Women's Health

## 2020-07-20 VITALS — BP 111/65 | HR 116 | Wt 151.0 lb

## 2020-07-20 DIAGNOSIS — Z331 Pregnant state, incidental: Secondary | ICD-10-CM

## 2020-07-20 DIAGNOSIS — Z3480 Encounter for supervision of other normal pregnancy, unspecified trimester: Secondary | ICD-10-CM

## 2020-07-20 DIAGNOSIS — Z3483 Encounter for supervision of other normal pregnancy, third trimester: Secondary | ICD-10-CM

## 2020-07-20 DIAGNOSIS — Z1389 Encounter for screening for other disorder: Secondary | ICD-10-CM

## 2020-07-20 LAB — POCT URINALYSIS DIPSTICK OB
Blood, UA: NEGATIVE
Glucose, UA: NEGATIVE
Ketones, UA: NEGATIVE
Leukocytes, UA: NEGATIVE
Nitrite, UA: NEGATIVE
POC,PROTEIN,UA: NEGATIVE

## 2020-07-20 NOTE — Patient Instructions (Signed)
Henderson Cloud, I greatly value your feedback.  If you receive a survey following your visit with [place] today, we appreciate you taking the time to fill it out.  Thanks, Knute Neu, CNM, WHNP-BC  [facility] ([place], [place] [id number]) Entrance C, located off of [address] parking   Go to [email or web address] to register for FREE online childbirth classes    Call the office [phone]) or go to [facility] if:  You begin to have strong, frequent contractions  Your water breaks.  Sometimes it is a big gush of fluid, sometimes it is just a trickle that keeps getting your panties wet or running down your legs  You have vaginal bleeding.  It is normal to have a small amount of spotting if your cervix was checked.   You don't feel your baby moving like normal.  If you don't, get you something to eat and drink and lay down and focus on feeling your baby move.  You should feel at least 10 movements in 2 hours.  If you don't, you should call the office or go to [facility].   Call the office [phone]) or go to [facility] hospital for these signs of pre-eclampsia:  Severe headache that does not go away with Tylenol  Visual changes- seeing spots, double, blurred vision  Pain under your right breast or upper abdomen that does not go away with Tums or heartburn medicine  Nausea and/or vomiting  Severe swelling in your hands, feet, and face    Home Blood Pressure Monitoring for Patients   Your provider has recommended that you check your blood pressure (BP) at least once a week at home. If you do not have a blood pressure cuff at home, one will be provided for you. Contact your provider if you have not received your monitor within 1 week.   Helpful Tips for Accurate Home Blood Pressure Checks   Don't smoke, exercise, or drink caffeine 30 minutes before checking your BP  Use the restroom before checking your BP (a full  bladder can raise your pressure)  Relax in a comfortable upright chair  Feet on the ground  Left arm resting comfortably on a flat surface at the level of your heart  Legs uncrossed  Back supported  Sit quietly and don't talk  Place the cuff on your bare arm  Adjust snuggly, so that only two fingertips can fit between your skin and the top of the cuff  Check 2 readings separated by at least one minute  Keep a log of your BP readings  For a visual, please reference this diagram: http://ccnc.care/bpdiagram  Provider Name: [facility]     Phone: [phone]  Zone 1: ALL CLEAR  Continue to monitor your symptoms:   BP reading is less than 140 (top number) or less than 90 (bottom number)   No right upper stomach pain  No headaches or seeing spots  No feeling nauseated or throwing up  No swelling in face and hands  Zone 2: CAUTION Call your doctor's office for any of the following:   BP reading is greater than 140 (top number) or greater than 90 (bottom number)   Stomach pain under your ribs in the middle or right side  Headaches or seeing spots  Feeling nauseated or throwing up  Swelling in face and hands  Zone 3: EMERGENCY  Seek immediate medical care if you have any of  the following:   BP reading is greater than160 (top number) or greater than 110 (bottom number)  Severe headaches not improving with Tylenol  Serious difficulty catching your breath  Any worsening symptoms from Zone 2  Preterm Labor and Birth Information  The normal length of a pregnancy is 39-41 weeks. Preterm labor is when labor starts before 37 completed weeks of pregnancy. What are the risk factors for preterm labor? Preterm labor is more likely to occur in women who:  Have certain infections during pregnancy such as a bladder infection, sexually transmitted infection, or infection inside the uterus (chorioamnionitis).  Have a shorter-than-normal cervix.  Have gone into  preterm labor before.  Have had surgery on their cervix.  Are younger than age 41 or older than age 1.  Are African American.  Are pregnant with twins or multiple babies (multiple gestation).  Take street drugs or smoke while pregnant.  Do not gain enough weight while pregnant.  Became pregnant shortly after having been pregnant. What are the symptoms of preterm labor? Symptoms of preterm labor include:  Cramps similar to those that can happen during a menstrual period. The cramps may happen with diarrhea.  Pain in the abdomen or lower back.  Regular uterine contractions that may feel like tightening of the abdomen.  A feeling of increased pressure in the pelvis.  Increased watery or bloody mucus discharge from the vagina.  Water breaking (ruptured amniotic sac). Why is it important to recognize signs of preterm labor? It is important to recognize signs of preterm labor because babies who are born prematurely may not be fully developed. This can put them at an increased risk for:  Long-term (chronic) heart and lung problems.  Difficulty immediately after birth with regulating body systems, including blood sugar, body temperature, heart rate, and breathing rate.  Bleeding in the brain.  Cerebral palsy.  Learning difficulties.  Death. These risks are highest for babies who are born before 76 weeks of pregnancy. How is preterm labor treated? Treatment depends on the length of your pregnancy, your condition, and the health of your baby. It may involve: 1. Having a stitch (suture) placed in your cervix to prevent your cervix from opening too early (cerclage). 2. Taking or being given medicines, such as: ? Hormone medicines. These may be given early in pregnancy to help support the pregnancy. ? Medicine to stop contractions. ? Medicines to help mature the babys lungs. These may be prescribed if the risk of delivery is high. ? Medicines to prevent your baby from  developing cerebral palsy. If the labor happens before 34 weeks of pregnancy, you may need to stay in the hospital. What should I do if I think I am in preterm labor? If you think that you are going into preterm labor, call your health care provider right away. How can I prevent preterm labor in future pregnancies? To increase your chance of having a full-term pregnancy:  Do not use any tobacco products, such as cigarettes, chewing tobacco, and e-cigarettes. If you need help quitting, ask your health care provider.  Do not use street drugs or medicines that have not been prescribed to you during your pregnancy.  Talk with your health care provider before taking any herbal supplements, even if you have been taking them regularly.  Make sure you gain a healthy amount of weight during your pregnancy.  Watch for infection. If you think that you might have an infection, get it checked right away.  Make sure to  tell your health care provider if you have gone into preterm labor before. This information is not intended to replace advice given to you by your health care provider. Make sure you discuss any questions you have with your health care provider. Document Revised: 02/13/2019 Document Reviewed: 03/14/2016 Elsevier Patient Education  [place].

## 2020-07-20 NOTE — Progress Notes (Signed)
LOW-RISK PREGNANCY VISIT Patient name: Lindsay Blackwell MRN [id number]  Date of birth: [birth year] Chief Complaint:   Routine Prenatal Visit  History of Present Illness:   Lindsay Blackwell is a 19 y.o. G70P1001 female at [redacted]w[redacted]d with an Estimated Date of Delivery: 09/02/20 being seen today for ongoing management of a low-risk pregnancy.  Depression screen [facility] 2/9 02/24/2020 03/03/2018  Decreased Interest 2 0  Down, Depressed, Hopeless 1 1  PHQ - 2 Score 3 1  Altered sleeping 2 1  Tired, decreased energy 2 1  Change in appetite 2 0  Feeling bad or failure about yourself  1 0  Trouble concentrating 1 0  Moving slowly or fidgety/restless 0 0  Suicidal thoughts 0 0  PHQ-9 Score 11 3  Difficult doing work/chores Somewhat difficult -    Today she reports no complaints. Contractions: Not present.  .  Movement: Present. denies leaking of fluid. Review of Systems:   Pertinent items are noted in HPI Denies abnormal vaginal discharge w/ itching/odor/irritation, headaches, visual changes, shortness of breath, chest pain, abdominal pain, severe nausea/vomiting, or problems with urination or bowel movements unless otherwise stated above. Pertinent History Reviewed:  Reviewed past medical,surgical, social, obstetrical and family history.  Reviewed problem list, medications and allergies. Physical Assessment:   Vitals:   07/20/20 1019  BP: 111/65  Pulse: (!) 116  Weight: 151 lb (68.5 kg)  Body mass index is 26.75 kg/m.        Physical Examination:   General appearance: Well appearing, and in no distress  Mental status: Alert, oriented to person, place, and time  Skin: Warm & dry  Cardiovascular: Normal heart rate noted  Respiratory: Normal respiratory effort, no distress  Abdomen: Soft, gravid, nontender  Pelvic: Cervical exam deferred         Extremities: Edema: None  Fetal Status: Fetal Heart Rate (bpm): 160 Fundal Height: 33 cm Movement: Present    Chaperone: n/a     Results for orders placed or performed in visit on 07/20/20 (from the past 24 hour(s))  POC Urinalysis Dipstick OB   Collection Time: 07/20/20 10:28 AM  Result Value Ref Range   Color, UA     Clarity, UA     Glucose, UA Negative Negative   Bilirubin, UA     Ketones, UA neg    Spec Grav, UA     Blood, UA neg    pH, UA     POC,PROTEIN,UA Negative Negative, Trace, Small (1+), Moderate (2+), Large (3+), 4+   Urobilinogen, UA     Nitrite, UA neg    Leukocytes, UA Negative Negative   Appearance     Odor      Assessment & Plan:  1) Low-risk pregnancy G2P1001 at [redacted]w[redacted]d with an Estimated Date of Delivery: 09/02/20   2) Recurrent UTIs w/ pyelo @ 27wks, on macrobid nightly suppression   Meds: No orders of the defined types were placed in this encounter.  Labs/procedures today: none  Plan:  Continue routine obstetrical care  Next visit: prefers in person    Reviewed: Preterm labor symptoms and general obstetric precautions including but not limited to vaginal bleeding, contractions, leaking of fluid and fetal movement were reviewed in detail with the patient.  All questions were answered. Has home bp cuff.  Check bp weekly, let us know if >140/90.   Follow-up: Return in about 2 weeks (around 08/03/2020) for [place], [place], in person.  Orders Placed This Encounter  Procedures  . POC Urinalysis  Dipstick OB   Roma Schanz CNM, [facility] 07/20/2020 10:40 AM

## 2020-08-03 ENCOUNTER — Other Ambulatory Visit: Payer: Self-pay

## 2020-08-03 ENCOUNTER — Ambulatory Visit (INDEPENDENT_AMBULATORY_CARE_PROVIDER_SITE_OTHER): Payer: Medicaid Other | Admitting: Advanced Practice Midwife

## 2020-08-03 VITALS — BP 107/69 | HR 100 | Wt 154.0 lb

## 2020-08-03 DIAGNOSIS — Z3A35 35 weeks gestation of pregnancy: Secondary | ICD-10-CM

## 2020-08-03 DIAGNOSIS — Z3480 Encounter for supervision of other normal pregnancy, unspecified trimester: Secondary | ICD-10-CM

## 2020-08-03 NOTE — Progress Notes (Signed)
   LOW-RISK PREGNANCY VISIT Patient name: Lindsay Blackwell MRN [id number]  Date of birth: July 01, 2001 Chief Complaint:   Routine Prenatal Visit  History of Present Illness:   Lindsay Blackwell is a 19 y.o. G36P1001 female at [redacted]w[redacted]d with an Estimated Date of Delivery: 09/02/20 being seen today for ongoing management of a low-risk pregnancy.  Today she reports doing well; taking nightly UTI suppression. Contractions: Not present. Vag. Bleeding: None.  Movement: Present. denies leaking of fluid. Review of Systems:   Pertinent items are noted in HPI Denies abnormal vaginal discharge w/ itching/odor/irritation, headaches, visual changes, shortness of breath, chest pain, abdominal pain, severe nausea/vomiting, or problems with urination or bowel movements unless otherwise stated above. Pertinent History Reviewed:  Reviewed past medical,surgical, social, obstetrical and family history.  Reviewed problem list, medications and allergies. Physical Assessment:   Vitals:   08/03/20 1050  BP: 107/69  Pulse: 100  Weight: 154 lb (69.9 kg)  Body mass index is 27.28 kg/m.        Physical Examination:   General appearance: Well appearing, and in no distress  Mental status: Alert, oriented to person, place, and time  Skin: Warm & dry  Cardiovascular: Normal heart rate noted  Respiratory: Normal respiratory effort, no distress  Abdomen: Soft, gravid, nontender  Pelvic: Cervical exam deferred         Extremities: Edema: None  Fetal Status: Fetal Heart Rate (bpm): 136 Fundal Height: 34 cm Movement: Present    No results found for this or any previous visit (from the past 24 hour(s)).  Assessment & Plan:  1) Low-risk pregnancy G2P1001 at [redacted]w[redacted]d with an Estimated Date of Delivery: 09/02/20   2) Pyelo in preg, taking nightly suppression   Meds: No orders of the defined types were placed in this encounter.  Labs/procedures today: none  Plan:  Continue routine obstetrical care   Reviewed:  Preterm labor symptoms and general obstetric precautions including but not limited to vaginal bleeding, contractions, leaking of fluid and fetal movement were reviewed in detail with the patient.  All questions were answered. Has home bp cuff. Check bp weekly, let us know if >140/90.   Follow-up: Return for 1-2wks; LROB; Pap with GBS/cultures.  No orders of the defined types were placed in this encounter.  Myrtis Ser CNM 08/03/2020 11:34 AM

## 2020-08-03 NOTE — Patient Instructions (Signed)
Lindsay Blackwell, I greatly value your feedback.  If you receive a survey following your visit with [place] today, we appreciate you taking the time to fill it out.  Thanks, Derrill Memo, [place] at [facility] ([place], [place] [address]) Entrance C, located off of [address] parking  Go to [email or web address] to register for FREE online childbirth classes   Call the office [phone]) or go to [facility] if:  You begin to have strong, frequent contractions  Your water breaks.  Sometimes it is a big gush of fluid, sometimes it is just a trickle that keeps getting your panties wet or running down your legs  You have vaginal bleeding.  It is normal to have a small amount of spotting if your cervix was checked.   You don't feel your baby moving like normal.  If you don't, get you something to eat and drink and lay down and focus on feeling your baby move.  You should feel at least 10 movements in 2 hours.  If you don't, you should call the office or go to [facility].    Tdap Vaccine  It is recommended that you get the Tdap vaccine during the third trimester of EACH pregnancy to help protect your baby from getting pertussis (whooping cough)  27-36 weeks is the BEST time to do this so that you can pass the protection on to your baby. During pregnancy is better than after pregnancy, but if you are unable to get it during pregnancy it will be offered at the hospital.   You can get this vaccine with [place], at the health department, your family doctor, or some local pharmacies  Everyone who will be around your baby should also be up-to-date on their vaccines before the baby comes. Adults (who are not pregnant) only need 1 dose of Tdap during adulthood.   [place] Pediatricians/Family Doctors:  [facility] [phone]                 [place]  [phone] (usually not accepting new patients unless you have family there already, you are always welcome to call and ask)       [facility] Department [phone]       [facility] Pediatricians/Family Doctors:   [facility]: [phone]  [facility]: [phone]  [facility]: [place] Doctors:   [facility]: [facility]: [place]:  [place]: [phone]   Home Blood Pressure Monitoring for Patients   Your provider has recommended that you check your blood pressure (BP) at least once a week at home. If you do not have a blood pressure cuff at home, one will be provided for you. Contact your provider if you have not received your monitor within 1 week.   Helpful Tips for Accurate Home Blood Pressure Checks  . Don't smoke, exercise, or drink caffeine 30 minutes before checking your BP . Use the restroom before checking your BP (a full bladder can raise your pressure) . Relax in a comfortable upright chair . Feet on the ground . Left arm resting comfortably on a flat surface at the level of your heart . Legs uncrossed . Back supported . Sit quietly and don't talk . Place the cuff on your bare  arm . Adjust snuggly, so that only two fingertips can fit between your skin and the top of the cuff . Check 2 readings separated by at least one minute . Keep a log of your BP readings . For a visual, please reference this diagram: http://ccnc.care/bpdiagram  Provider Name: [facility]     Phone: [phone]  Zone 1: ALL CLEAR  Continue to monitor your symptoms:  . BP reading is less than 140 (top number) or less than 90 (bottom number)  . No right upper stomach pain . No headaches or seeing spots . No feeling nauseated or throwing up . No swelling in face and hands  Zone 2: CAUTION Call your  doctor's office for any of the following:  . BP reading is greater than 140 (top number) or greater than 90 (bottom number)  . Stomach pain under your ribs in the middle or right side . Headaches or seeing spots . Feeling nauseated or throwing up . Swelling in face and hands  Zone 3: EMERGENCY  Seek immediate medical care if you have any of the following:  . BP reading is greater than160 (top number) or greater than 110 (bottom number) . Severe headaches not improving with Tylenol . Serious difficulty catching your breath . Any worsening symptoms from Zone 2   Third Trimester of Pregnancy The third trimester is from week 29 through week 42, months 7 through 9. The third trimester is a time when the fetus is growing rapidly. At the end of the ninth month, the fetus is about 20 inches in length and weighs 6-10 pounds.  BODY CHANGES Your body goes through many changes during pregnancy. The changes vary from woman to woman.   Your weight will continue to increase. You can expect to gain 25-35 pounds (11-16 kg) by the end of the pregnancy.  You may begin to get stretch marks on your hips, abdomen, and breasts.  You may urinate more often because the fetus is moving lower into your pelvis and pressing on your bladder.  You may develop or continue to have heartburn as a result of your pregnancy.  You may develop constipation because certain hormones are causing the muscles that push waste through your intestines to slow down.  You may develop hemorrhoids or swollen, bulging veins (varicose veins).  You may have pelvic pain because of the weight gain and pregnancy hormones relaxing your joints between the bones in your pelvis. Backaches may result from overexertion of the muscles supporting your posture.  You may have changes in your hair. These can include thickening of your hair, rapid growth, and changes in texture. Some women also have hair loss during or after pregnancy, or hair that  feels dry or thin. Your hair will most likely return to normal after your baby is born.  Your breasts will continue to grow and be tender. A yellow discharge may leak from your breasts called colostrum.  Your belly button may stick out.  You may feel short of breath because of your expanding uterus.  You may notice the fetus "dropping," or moving lower in your abdomen.  You may have a bloody mucus discharge. This usually occurs a few days to a week before labor begins.  Your cervix becomes thin and soft (effaced) near your due date. WHAT TO EXPECT AT YOUR PRENATAL EXAMS  You will have prenatal exams every 2 weeks until week 36. Then, you will have weekly prenatal exams. During a routine prenatal visit:  You  will be weighed to make sure you and the fetus are growing normally.  Your blood pressure is taken.  Your abdomen will be measured to track your baby's growth.  The fetal heartbeat will be listened to.  Any test results from the previous visit will be discussed.  You may have a cervical check near your due date to see if you have effaced. At around 36 weeks, your caregiver will check your cervix. At the same time, your caregiver will also perform a test on the secretions of the vaginal tissue. This test is to determine if a type of bacteria, Group B streptococcus, is present. Your caregiver will explain this further. Your caregiver may ask you:  What your birth plan is.  How you are feeling.  If you are feeling the baby move.  If you have had any abnormal symptoms, such as leaking fluid, bleeding, severe headaches, or abdominal cramping.  If you have any questions. Other tests or screenings that may be performed during your third trimester include:  Blood tests that check for low iron levels (anemia).  Fetal testing to check the health, activity level, and growth of the fetus. Testing is done if you have certain medical conditions or if there are problems during the  pregnancy. FALSE LABOR You may feel small, irregular contractions that eventually go away. These are called Braxton Hicks contractions, or false labor. Contractions may last for hours, days, or even weeks before true labor sets in. If contractions come at regular intervals, intensify, or become painful, it is best to be seen by your caregiver.  SIGNS OF LABOR   Menstrual-like cramps.  Contractions that are 5 minutes apart or less.  Contractions that start on the top of the uterus and spread down to the lower abdomen and back.  A sense of increased pelvic pressure or back pain.  A watery or bloody mucus discharge that comes from the vagina. If you have any of these signs before the 37th week of pregnancy, call your caregiver right away. You need to go to the hospital to get checked immediately. HOME CARE INSTRUCTIONS   Avoid all smoking, herbs, alcohol, and unprescribed drugs. These chemicals affect the formation and growth of the baby.  Follow your caregiver's instructions regarding medicine use. There are medicines that are either safe or unsafe to take during pregnancy.  Exercise only as directed by your caregiver. Experiencing uterine cramps is a good sign to stop exercising.  Continue to eat regular, healthy meals.  Wear a good support bra for breast tenderness.  Do not use hot tubs, steam rooms, or saunas.  Wear your seat belt at all times when driving.  Avoid raw meat, uncooked cheese, cat litter boxes, and soil used by cats. These carry germs that can cause birth defects in the baby.  Take your prenatal vitamins.  Try taking a stool softener (if your caregiver approves) if you develop constipation. Eat more high-fiber foods, such as fresh vegetables or fruit and whole grains. Drink plenty of fluids to keep your urine clear or pale yellow.  Take warm sitz baths to soothe any pain or discomfort caused by hemorrhoids. Use hemorrhoid cream if your caregiver approves.  If you  develop varicose veins, wear support hose. Elevate your feet for 15 minutes, 3-4 times a day. Limit salt in your diet.  Avoid heavy lifting, wear low heal shoes, and practice good posture.  Rest a lot with your legs elevated if you have leg cramps or low back  pain.  Visit your dentist if you have not gone during your pregnancy. Use a soft toothbrush to brush your teeth and be gentle when you floss.  A sexual relationship may be continued unless your caregiver directs you otherwise.  Do not travel far distances unless it is absolutely necessary and only with the approval of your caregiver.  Take prenatal classes to understand, practice, and ask questions about the labor and delivery.  Make a trial run to the hospital.  Pack your hospital bag.  Prepare the baby's nursery.  Continue to go to all your prenatal visits as directed by your caregiver. SEEK MEDICAL CARE IF:  You are unsure if you are in labor or if your water has broken.  You have dizziness.  You have mild pelvic cramps, pelvic pressure, or nagging pain in your abdominal area.  You have persistent nausea, vomiting, or diarrhea.  You have a bad smelling vaginal discharge.  You have pain with urination. SEEK IMMEDIATE MEDICAL CARE IF:   You have a fever.  You are leaking fluid from your vagina.  You have spotting or bleeding from your vagina.  You have severe abdominal cramping or pain.  You have rapid weight loss or gain.  You have shortness of breath with chest pain.  You notice sudden or extreme swelling of your face, hands, ankles, feet, or legs.  You have not felt your baby move in over an hour.  You have severe headaches that do not go away with medicine.  You have vision changes. Document Released: 10/16/2001 Document Revised: 10/27/2013 Document Reviewed: 12/23/2012 [facility] Patient Information 2015 [place], [place]. This information is not intended to replace advice given to you by your health  care provider. Make sure you discuss any questions you have with your health care provider.

## 2020-08-17 ENCOUNTER — Encounter: Payer: Self-pay | Admitting: Advanced Practice Midwife

## 2020-08-17 ENCOUNTER — Other Ambulatory Visit (HOSPITAL_COMMUNITY)
Admission: RE | Admit: 2020-08-17 | Discharge: 2020-08-17 | Disposition: A | Discharge status: Home / Self Care | Payer: Medicaid Other | Source: Ambulatory Visit | Attending: Advanced Practice Midwife | Admitting: Advanced Practice Midwife

## 2020-08-17 ENCOUNTER — Other Ambulatory Visit: Payer: Self-pay

## 2020-08-17 ENCOUNTER — Ambulatory Visit (INDEPENDENT_AMBULATORY_CARE_PROVIDER_SITE_OTHER): Payer: Medicaid Other | Admitting: Advanced Practice Midwife

## 2020-08-17 VITALS — BP 98/75 | HR 95 | Wt 159.4 lb

## 2020-08-17 DIAGNOSIS — Z3480 Encounter for supervision of other normal pregnancy, unspecified trimester: Secondary | ICD-10-CM

## 2020-08-17 DIAGNOSIS — Z3A37 37 weeks gestation of pregnancy: Secondary | ICD-10-CM | POA: Diagnosis not present

## 2020-08-17 DIAGNOSIS — Z1389 Encounter for screening for other disorder: Secondary | ICD-10-CM

## 2020-08-17 DIAGNOSIS — Z124 Encounter for screening for malignant neoplasm of cervix: Secondary | ICD-10-CM | POA: Insufficient documentation

## 2020-08-17 DIAGNOSIS — Z331 Pregnant state, incidental: Secondary | ICD-10-CM

## 2020-08-17 LAB — POCT URINALYSIS DIPSTICK OB
Blood, UA: NEGATIVE
Glucose, UA: NEGATIVE
Ketones, UA: NEGATIVE
Leukocytes, UA: NEGATIVE
Nitrite, UA: NEGATIVE
POC,PROTEIN,UA: NEGATIVE

## 2020-08-17 NOTE — Progress Notes (Signed)
   LOW-RISK PREGNANCY VISIT Patient name: Lindsay Blackwell MRN [id number]  Date of birth: 2001-05-08 Chief Complaint:   Routine Prenatal Visit (pap smear with gc- chl/ gbs/ back , stomach pain)  History of Present Illness:   Lindsay Blackwell is a 19 y.o. G107P1001 female at [redacted]w[redacted]d with an Estimated Date of Delivery: 09/02/20 being seen today for ongoing management of a low-risk pregnancy.  Today she reports intermittent back discomfort; some abd discomfort. Contractions: Not present.  .  Movement: Present. denies leaking of fluid. Review of Systems:   Pertinent items are noted in HPI Denies abnormal vaginal discharge w/ itching/odor/irritation, headaches, visual changes, shortness of breath, chest pain, abdominal pain, severe nausea/vomiting, or problems with urination or bowel movements unless otherwise stated above. Pertinent History Reviewed:  Reviewed past medical,surgical, social, obstetrical and family history.  Reviewed problem list, medications and allergies. Physical Assessment:   Vitals:   08/17/20 0926  BP: 98/75  Pulse: 95  Weight: 159 lb 6.4 oz (72.3 kg)  Body mass index is 28.24 kg/m.        Physical Examination:   General appearance: Well appearing, and in no distress  Mental status: Alert, oriented to person, place, and time  Skin: Warm & dry  Cardiovascular: Normal heart rate noted  Respiratory: Normal respiratory effort, no distress  Abdomen: Soft, gravid, nontender  Pelvic: Cervical exam deferred         Extremities: Edema: None  Fetal Status: Fetal Heart Rate (bpm): 132 Fundal Height: 37 cm Movement: Present Presentation: Vertex  Results for orders placed or performed in visit on 08/17/20 (from the past 24 hour(s))  POC Urinalysis Dipstick OB   Collection Time: 08/17/20 10:04 AM  Result Value Ref Range   Color, UA     Clarity, UA     Glucose, UA Negative Negative   Bilirubin, UA     Ketones, UA neg    Spec Grav, UA     Blood, UA neg    pH, UA       POC,PROTEIN,UA Negative Negative, Trace, Small (1+), Moderate (2+), Large (3+), 4+   Urobilinogen, UA     Nitrite, UA neg    Leukocytes, UA Negative Negative   Appearance     Odor      Assessment & Plan:  1) Low-risk pregnancy G2P1001 at [redacted]w[redacted]d with an Estimated Date of Delivery: 09/02/20   2) Discomforts of late preg, rev'd s/s labor   Meds: No orders of the defined types were placed in this encounter.  Labs/procedures today: Pap/GC/chlam/GBS  Plan:  Continue routine obstetrical care   Reviewed: Term labor symptoms and general obstetric precautions including but not limited to vaginal bleeding, contractions, leaking of fluid and fetal movement were reviewed in detail with the patient.  All questions were answered. Has home bp cuff. Check bp weekly, let us know if >140/90.   Follow-up: Return in about 1 week (around 08/24/2020) for [place], in person.  Orders Placed This Encounter  Procedures  . Strep Gp B NAA  . POC Urinalysis Dipstick OB   [facility] 08/17/2020 10:21 AM

## 2020-08-17 NOTE — Patient Instructions (Signed)
Braxton Hicks Contractions °Contractions of the uterus can occur throughout pregnancy, but they are not always a sign that you are in labor. You may have practice contractions called Braxton Hicks contractions. These false labor contractions are sometimes confused with true labor. °What are Braxton Hicks contractions? °Braxton Hicks contractions are tightening movements that occur in the muscles of the uterus before labor. Unlike true labor contractions, these contractions do not result in opening (dilation) and thinning of the cervix. Toward the end of pregnancy (32-34 weeks), Braxton Hicks contractions can happen more often and may become stronger. These contractions are sometimes difficult to tell apart from true labor because they can be very uncomfortable. You should not feel embarrassed if you go to the hospital with false labor. °Sometimes, the only way to tell if you are in true labor is for your health care provider to look for changes in the cervix. The health care provider will do a physical exam and may monitor your contractions. If you are not in true labor, the exam should show that your cervix is not dilating and your water has not broken. °If there are no other health problems associated with your pregnancy, it is completely safe for you to be sent home with false labor. You may continue to have Braxton Hicks contractions until you go into true labor. °How to tell the difference between true labor and false labor °True labor °· Contractions last 30-70 seconds. °· Contractions become very regular. °· Discomfort is usually felt in the top of the uterus, and it spreads to the lower abdomen and low back. °· Contractions do not go away with walking. °· Contractions usually become more intense and increase in frequency. °· The cervix dilates and gets thinner. °False labor °· Contractions are usually shorter and not as strong as true labor contractions. °· Contractions are usually irregular. °· Contractions  are often felt in the front of the lower abdomen and in the groin. °· Contractions may go away when you walk around or change positions while lying down. °· Contractions get weaker and are shorter-lasting as time goes on. °· The cervix usually does not dilate or become thin. °Follow these instructions at home: ° °· Take over-the-counter and prescription medicines only as told by your health care provider. °· Keep up with your usual exercises and follow other instructions from your health care provider. °· Eat and drink lightly if you think you are going into labor. °· If Braxton Hicks contractions are making you uncomfortable: °? Change your position from lying down or resting to walking, or change from walking to resting. °? Sit and rest in a tub of warm water. °? Drink enough fluid to keep your urine pale yellow. Dehydration may cause these contractions. °? Do slow and deep breathing several times an hour. °· Keep all follow-up prenatal visits as told by your health care provider. This is important. °Contact a health care provider if: °· You have a fever. °· You have continuous pain in your abdomen. °Get help right away if: °· Your contractions become stronger, more regular, and closer together. °· You have fluid leaking or gushing from your vagina. °· You pass blood-tinged mucus (bloody show). °· You have bleeding from your vagina. °· You have low back pain that you never had before. °· You feel your baby’s head pushing down and causing pelvic pressure. °· Your baby is not moving inside you as much as it used to. °Summary °· Contractions that occur before labor are   called Braxton Hicks contractions, false labor, or practice contractions. °· Braxton Hicks contractions are usually shorter, weaker, farther apart, and less regular than true labor contractions. True labor contractions usually become progressively stronger and regular, and they become more frequent. °· Manage discomfort from Braxton Hicks contractions  by changing position, resting in a warm bath, drinking plenty of water, or practicing deep breathing. °This information is not intended to replace advice given to you by your health care provider. Make sure you discuss any questions you have with your health care provider. °Document Revised: 10/04/2017 Document Reviewed: 03/07/2017 °Elsevier Patient Education © 2020 [employer] ° °

## 2020-08-19 LAB — STREP GP B NAA: Strep Gp B NAA: NEGATIVE

## 2020-08-24 ENCOUNTER — Ambulatory Visit (INDEPENDENT_AMBULATORY_CARE_PROVIDER_SITE_OTHER): Payer: Medicaid Other | Admitting: Obstetrics and Gynecology

## 2020-08-24 ENCOUNTER — Encounter: Payer: Self-pay | Admitting: Obstetrics and Gynecology

## 2020-08-24 VITALS — BP 117/71 | HR 76 | Wt 159.2 lb

## 2020-08-24 DIAGNOSIS — Z3480 Encounter for supervision of other normal pregnancy, unspecified trimester: Secondary | ICD-10-CM

## 2020-08-24 NOTE — Progress Notes (Signed)
Subjective:  Lindsay Blackwell is a 19 y.o. G2P1001 at [redacted]w[redacted]d being seen today for ongoing prenatal care.  She is currently monitored for the following issues for this low-risk pregnancy and has Encounter for supervision of other normal pregnancy, unspecified trimester on their problem list.  Patient reports general discomforts of pregnancy.  Contractions: Not present. Vag. Bleeding: None.  Movement: Present. Denies leaking of fluid.   The following portions of the patient's history were reviewed and updated as appropriate: allergies, current medications, past family history, past medical history, past social history, past surgical history and problem list. Problem list updated.  Objective:   Vitals:   08/24/20 1011  BP: 117/71  Pulse: 76  Weight: 159 lb 3.2 oz (72.2 kg)    Fetal Status:     Movement: Present     General:  Alert, oriented and cooperative. Patient is in no acute distress.  Skin: Skin is warm and dry. No rash noted.   Cardiovascular: Normal heart rate noted  Respiratory: Normal respiratory effort, no problems with respiration noted  Abdomen: Soft, gravid, appropriate for gestational age. Pain/Pressure: Present     Pelvic:  Cervical exam deferred        Extremities: Normal range of motion.  Edema: None  Mental Status: Normal mood and affect. Normal behavior. Normal judgment and thought content.   Urinalysis:      Assessment and Plan:  Pregnancy: G2P1001 at [redacted]w[redacted]d  1. Encounter for supervision of other normal pregnancy, unspecified trimester Stable Labor precautions  Term labor symptoms and general obstetric precautions including but not limited to vaginal bleeding, contractions, leaking of fluid and fetal movement were reviewed in detail with the patient. Please refer to After Visit Summary for other counseling recommendations.  Return in about 1 week (around 08/31/2020) for OB visit, face to face, any provider.   Chancy Milroy, MD

## 2020-08-24 NOTE — Patient Instructions (Signed)

## 2020-08-25 LAB — CYTOLOGY - PAP
Chlamydia: NEGATIVE
Comment: NEGATIVE
Comment: NEGATIVE
Comment: NEGATIVE
Comment: NORMAL
HPV 16: NEGATIVE
HPV 18 / 45: NEGATIVE
High risk HPV: POSITIVE — AB
Neisseria Gonorrhea: NEGATIVE

## 2020-08-28 ENCOUNTER — Encounter: Payer: Self-pay | Admitting: Advanced Practice Midwife

## 2020-08-28 DIAGNOSIS — R87619 Unspecified abnormal cytological findings in specimens from cervix uteri: Secondary | ICD-10-CM | POA: Insufficient documentation

## 2020-08-31 ENCOUNTER — Other Ambulatory Visit: Payer: Self-pay | Admitting: Advanced Practice Midwife

## 2020-08-31 ENCOUNTER — Encounter: Payer: Self-pay | Admitting: Advanced Practice Midwife

## 2020-08-31 ENCOUNTER — Telehealth (INDEPENDENT_AMBULATORY_CARE_PROVIDER_SITE_OTHER): Payer: Medicaid Other | Admitting: Advanced Practice Midwife

## 2020-08-31 VITALS — BP 125/71 | HR 81

## 2020-08-31 DIAGNOSIS — Z3480 Encounter for supervision of other normal pregnancy, unspecified trimester: Secondary | ICD-10-CM

## 2020-08-31 DIAGNOSIS — Z3A39 39 weeks gestation of pregnancy: Secondary | ICD-10-CM

## 2020-08-31 DIAGNOSIS — Z3483 Encounter for supervision of other normal pregnancy, third trimester: Secondary | ICD-10-CM

## 2020-08-31 NOTE — Progress Notes (Signed)
TELEHEALTH VIRTUAL OBSTETRICS VISIT ENCOUNTER NOTE Patient name: Lindsay Blackwell MRN [id number]  Date of birth: 2001-09-01  I connected with patient on 08/31/20 at  4:10 PM EDT by MyChart and verified that I am speaking with the correct person using two identifiers. Due to COVID-19 recommendations, pt is not currently in our office however I am; she is at her home.    I discussed the limitations, risks, security and privacy concerns of performing an evaluation and management service by telephone and the availability of in person appointments. I also discussed with the patient that there may be a patient responsible charge related to this service. The patient expressed understanding and agreed to proceed.  Chief Complaint:   Routine Prenatal Visit  History of Present Illness:   Lindsay Blackwell is a 19 y.o. G31P1001 female at [redacted]w[redacted]d with an Estimated Date of Delivery: 09/02/20 being evaluated today for ongoing management of a low-risk pregnancy.  Depression screen [facility] 2/9 02/24/2020 03/03/2018  Decreased Interest 2 0  Down, Depressed, Hopeless 1 1  PHQ - 2 Score 3 1  Altered sleeping 2 1  Tired, decreased energy 2 1  Change in appetite 2 0  Feeling bad or failure about yourself  1 0  Trouble concentrating 1 0  Moving slowly or fidgety/restless 0 0  Suicidal thoughts 0 0  PHQ-9 Score 11 3  Difficult doing work/chores Somewhat difficult -    Today she reports pelvic pressure. Contractions: Irritability. Vag. Bleeding: None.  Movement: Present. denies leaking of fluid. Review of Systems:   Pertinent items are noted in HPI Denies abnormal vaginal discharge w/ itching/odor/irritation, headaches, visual changes, shortness of breath, chest pain, abdominal pain, severe nausea/vomiting, or problems with urination or bowel movements unless otherwise stated above. Pertinent History Reviewed:  Reviewed past medical,surgical, social, obstetrical and family history.  Reviewed problem list,  medications and allergies. Physical Assessment:   Vitals:   08/31/20 1605  BP: 125/71  Pulse: 81  There is no height or weight on file to calculate BMI.        Physical Examination:   General:  Alert, oriented and cooperative.   Mental Status: Normal mood and affect perceived. Normal judgment and thought content.  Rest of physical exam deferred due to type of encounter  No results found for this or any previous visit (from the past 24 hour(s)).  Assessment & Plan:  1) Pregnancy G2P1001 at [redacted]w[redacted]d with an Estimated Date of Delivery: 09/02/20     Meds: No orders of the defined types were placed in this encounter.   Labs/procedures today: none  Plan:  Continue routine obstetrical care with NST/LROB on 09/05/20 and postdates IOL scheduled for 09/09/20 AM (request faxed; orders put in; eval for outpt foley candidate on 11/1).  Has home bp cuff.  Check bp weekly, let us know if >140/90.  Next visit: prefers will be in person for NST    Reviewed: Term labor symptoms and general obstetric precautions including but not limited to vaginal bleeding, contractions, leaking of fluid and fetal movement were reviewed in detail with the patient. The patient was advised to call back or seek an in-person office evaluation/go to MAU at [facility] for any urgent or concerning symptoms. All questions were answered. Please refer to After Visit Summary for other counseling recommendations.    I provided 10 minutes of non-face-to-face time during this encounter.  Follow-up: Return in 5 days (on 09/05/2020) for LROB, NST, in person.  No orders  of the defined types were placed in this encounter.  Myrtis Ser CNM 08/31/2020 4:33 PM

## 2020-08-31 NOTE — Progress Notes (Signed)
  Induction Assessment Scheduling Form Fax to [facility]:  [phone] [facility] [place]                                                                                   DOB:  2001/07/30                                                            MRN:  [id number]                                                                     Phone:  Home Phone [phone]  Mobile [phone]                        Provider: [facility]  GP:  [id number]                                                            Estimated Date of Delivery: 09/02/20  Dating Criteria: LMP c/w u/s at [redacted]wks    Gestational age on admission:  41.0wks Medical Indications for induction:  postdates Admission Date/Time:  Nov 5th, 2021 AM (if [facility] already has 4 inductions on for this day please notify scheduling provider)   There were no vitals filed for this visit. HIV:  Non Reactive (08/04 6761) GBS: Negative/-- (10/13 1330)      Method of induction(proposed):  Cytotec Outpatient foley bulb candidate: Yes- will check with her on 11/1 and schedule prn.    Scheduling Provider Signature:  Myrtis Ser, CNM                                            Today's Date:  08/31/2020

## 2020-09-01 ENCOUNTER — Inpatient Hospital Stay (HOSPITAL_COMMUNITY)
Admission: AD | Admit: 2020-09-01 | Discharge: 2020-09-02 | DRG: 807 | Disposition: A | Discharge status: Home / Self Care | Payer: Medicaid Other | Attending: Obstetrics & Gynecology | Admitting: Obstetrics & Gynecology

## 2020-09-01 ENCOUNTER — Encounter (HOSPITAL_COMMUNITY): Payer: Self-pay | Admitting: Obstetrics & Gynecology

## 2020-09-01 DIAGNOSIS — O480 Post-term pregnancy: Secondary | ICD-10-CM | POA: Diagnosis not present

## 2020-09-01 DIAGNOSIS — Z30017 Encounter for initial prescription of implantable subdermal contraceptive: Secondary | ICD-10-CM

## 2020-09-01 DIAGNOSIS — Z3A39 39 weeks gestation of pregnancy: Secondary | ICD-10-CM

## 2020-09-01 DIAGNOSIS — O26893 Other specified pregnancy related conditions, third trimester: Secondary | ICD-10-CM | POA: Diagnosis not present

## 2020-09-01 DIAGNOSIS — Z20822 Contact with and (suspected) exposure to covid-19: Secondary | ICD-10-CM | POA: Diagnosis present

## 2020-09-01 LAB — CBC
HCT: 37.4 % (ref 36.0–46.0)
Hemoglobin: 12.5 g/dL (ref 12.0–15.0)
MCH: 29.2 pg (ref 26.0–34.0)
MCHC: 33.4 g/dL (ref 30.0–36.0)
MCV: 87.4 fL (ref 80.0–100.0)
Platelets: 132 10*3/uL — ABNORMAL LOW (ref 150–400)
RBC: 4.28 MIL/uL (ref 3.87–5.11)
RDW: 14.7 % (ref 11.5–15.5)
WBC: 8.9 10*3/uL (ref 4.0–10.5)
nRBC: 0 % (ref 0.0–0.2)

## 2020-09-01 LAB — TYPE AND SCREEN
ABO/RH(D): O POS
Antibody Screen: NEGATIVE

## 2020-09-01 LAB — RESPIRATORY PANEL BY RT PCR (FLU A&B, COVID)
Influenza A by PCR: NEGATIVE
Influenza B by PCR: NEGATIVE
SARS Coronavirus 2 by RT PCR: NEGATIVE

## 2020-09-01 MED ORDER — PRENATAL MULTIVITAMIN CH
1.0000 | ORAL_TABLET | Freq: Every day | ORAL | Status: DC
  Administered 2020-09-01 – 2020-09-02 (×2): 1 via ORAL
  Filled 2020-09-01 (×2): qty 1

## 2020-09-01 MED ORDER — ONDANSETRON HCL 4 MG/2ML IJ SOLN: 4.0000 mg | INTRAMUSCULAR | Status: DC | PRN

## 2020-09-01 MED ORDER — MISOPROSTOL 50MCG HALF TABLET: 50.0000 ug | ORAL_TABLET | ORAL | Status: DC | PRN

## 2020-09-01 MED ORDER — OXYTOCIN-SODIUM CHLORIDE 30-0.9 UT/500ML-% IV SOLN
INTRAVENOUS | Status: AC
  Filled 2020-09-01: qty 500

## 2020-09-01 MED ORDER — ERYTHROMYCIN 5 MG/GM OP OINT
TOPICAL_OINTMENT | OPHTHALMIC | Status: AC
  Filled 2020-09-01: qty 1

## 2020-09-01 MED ORDER — SOD CITRATE-CITRIC ACID 500-334 MG/5ML PO SOLN: 30.0000 mL | ORAL | Status: DC | PRN

## 2020-09-01 MED ORDER — SIMETHICONE 80 MG PO CHEW: 80.0000 mg | CHEWABLE_TABLET | ORAL | Status: DC | PRN

## 2020-09-01 MED ORDER — FENTANYL CITRATE (PF) 100 MCG/2ML IJ SOLN: 100.0000 ug | INTRAMUSCULAR | Status: DC | PRN

## 2020-09-01 MED ORDER — OXYCODONE-ACETAMINOPHEN 5-325 MG PO TABS: 1.0000 | ORAL_TABLET | ORAL | Status: DC | PRN

## 2020-09-01 MED ORDER — LACTATED RINGERS IV SOLN: INTRAVENOUS | Status: DC

## 2020-09-01 MED ORDER — LACTATED RINGERS IV SOLN: 500.0000 mL | INTRAVENOUS | Status: DC | PRN

## 2020-09-01 MED ORDER — WITCH HAZEL-GLYCERIN EX PADS: 1.0000 "application " | MEDICATED_PAD | CUTANEOUS | Status: DC | PRN

## 2020-09-01 MED ORDER — DIPHENHYDRAMINE HCL 25 MG PO CAPS: 25.0000 mg | ORAL_CAPSULE | Freq: Four times a day (QID) | ORAL | Status: DC | PRN

## 2020-09-01 MED ORDER — IBUPROFEN 600 MG PO TABS
600.0000 mg | ORAL_TABLET | Freq: Four times a day (QID) | ORAL | Status: DC
  Administered 2020-09-01 – 2020-09-02 (×5): 600 mg via ORAL
  Filled 2020-09-01 (×5): qty 1

## 2020-09-01 MED ORDER — ZOLPIDEM TARTRATE 5 MG PO TABS: 5.0000 mg | ORAL_TABLET | Freq: Every evening | ORAL | Status: DC | PRN

## 2020-09-01 MED ORDER — ACETAMINOPHEN 325 MG PO TABS: 650.0000 mg | ORAL_TABLET | ORAL | Status: DC | PRN

## 2020-09-01 MED ORDER — DIBUCAINE (PERIANAL) 1 % EX OINT
1.0000 "application " | TOPICAL_OINTMENT | CUTANEOUS | Status: DC | PRN
  Filled 2020-09-01: qty 28

## 2020-09-01 MED ORDER — OXYTOCIN-SODIUM CHLORIDE 30-0.9 UT/500ML-% IV SOLN: 1.0000 m[IU]/min | Freq: Once | INTRAVENOUS | Status: DC

## 2020-09-01 MED ORDER — OXYTOCIN-SODIUM CHLORIDE 30-0.9 UT/500ML-% IV SOLN: 2.5000 [IU]/h | INTRAVENOUS | Status: DC

## 2020-09-01 MED ORDER — LIDOCAINE HCL (PF) 1 % IJ SOLN
INTRAMUSCULAR | Status: AC
  Administered 2020-09-01: 5 mL via SUBCUTANEOUS
  Filled 2020-09-01: qty 30

## 2020-09-01 MED ORDER — LIDOCAINE HCL (PF) 1 % IJ SOLN: 30.0000 mL | INTRAMUSCULAR | Status: DC | PRN

## 2020-09-01 MED ORDER — OXYTOCIN BOLUS FROM INFUSION: 333.0000 mL | Freq: Once | INTRAVENOUS | Status: DC

## 2020-09-01 MED ORDER — TERBUTALINE SULFATE 1 MG/ML IJ SOLN: 0.2500 mg | Freq: Once | INTRAMUSCULAR | Status: DC | PRN

## 2020-09-01 MED ORDER — SENNOSIDES-DOCUSATE SODIUM 8.6-50 MG PO TABS
2.0000 | ORAL_TABLET | ORAL | Status: DC
  Administered 2020-09-02: 2 via ORAL
  Filled 2020-09-01: qty 2

## 2020-09-01 MED ORDER — ACETAMINOPHEN 325 MG PO TABS
650.0000 mg | ORAL_TABLET | ORAL | Status: DC | PRN
  Administered 2020-09-01: 650 mg via ORAL
  Filled 2020-09-01: qty 2

## 2020-09-01 MED ORDER — ONDANSETRON HCL 4 MG/2ML IJ SOLN: 4.0000 mg | Freq: Four times a day (QID) | INTRAMUSCULAR | Status: DC | PRN

## 2020-09-01 MED ORDER — COCONUT OIL OIL
1.0000 "application " | TOPICAL_OIL | Status: DC | PRN
  Administered 2020-09-02: 1 via TOPICAL
  Filled 2020-09-01: qty 120

## 2020-09-01 MED ORDER — ONDANSETRON HCL 4 MG PO TABS: 4.0000 mg | ORAL_TABLET | ORAL | Status: DC | PRN

## 2020-09-01 MED ORDER — OXYCODONE-ACETAMINOPHEN 5-325 MG PO TABS: 2.0000 | ORAL_TABLET | ORAL | Status: DC | PRN

## 2020-09-01 MED ORDER — BENZOCAINE-MENTHOL 20-0.5 % EX AERO
1.0000 "application " | INHALATION_SPRAY | CUTANEOUS | Status: DC | PRN
  Administered 2020-09-01: 1 via TOPICAL
  Filled 2020-09-01 (×2): qty 56

## 2020-09-01 MED ORDER — TETANUS-DIPHTH-ACELL PERTUSSIS 5-2.5-18.5 LF-MCG/0.5 IM SUSY: 0.5000 mL | PREFILLED_SYRINGE | Freq: Once | INTRAMUSCULAR | Status: DC

## 2020-09-01 MED ORDER — FENTANYL CITRATE (PF) 100 MCG/2ML IJ SOLN
100.0000 ug | Freq: Once | INTRAMUSCULAR | Status: DC
  Administered 2020-09-01: 100 ug via INTRAVENOUS
  Filled 2020-09-01: qty 2

## 2020-09-01 NOTE — MAU Note (Signed)
Pt presents to MAU with complaints of contractions that started around 5 this morning.

## 2020-09-01 NOTE — Discharge Instructions (Signed)

## 2020-09-01 NOTE — H&P (Signed)
Obstetric History and Physical  Lindsay Blackwell is a 19 y.o. G2P1001 with IUP at [redacted]w[redacted]d presenting for active labor. Patient states she has been having  regular, every 4-5 minutes contractions, minimal vaginal bleeding, intact membranes, with active fetal movement.    Prenatal Course [facility] Pregnancy complications or risks: Patient Active Problem List   Diagnosis Date Noted  . Post term pregnancy 09/01/2020  . Vaginal delivery 09/01/2020  . Abnormal Pap smear of cervix 08/28/2020  . Encounter for supervision of other normal pregnancy, unspecified trimester 02/24/2020   She plans to breastfeed She desires Depo-Provera for postpartum contraception.   Prenatal labs and studies: ABO, Rh: --/--/O POS (10/28 9357) Antibody: NEG (10/28 0177) Rubella: 2.83 (04/21 1128) RPR: Non Reactive (08/04 0921)  HBsAg: Negative (04/21 1128)  HIV: Non Reactive (08/04 0921)  LTJ:QZESPQZR/-- (10/13 1330) 2 hr Glucola  neg Genetic screening normal Anatomy US normal  Prenatal Transfer Tool  Maternal Diabetes: No Genetic Screening: Normal Maternal Ultrasounds/Referrals: Normal Fetal Ultrasounds or other Referrals:  None Maternal Substance Abuse:  No Significant Maternal Medications:  None Significant Maternal Lab Results: Group B Strep negative  Past Medical History:  Diagnosis Date  . Medical history non-contributory     Past Surgical History:  Procedure Laterality Date  . NO PAST SURGERIES      OB History  Gravida Para Term Preterm AB Living  2 1 1  0 0 1  SAB TAB Ectopic Multiple Live Births  0 0 0 0 1    # Outcome Date GA Lbr Len/2nd Weight Sex Delivery Anes PTL Lv  2 Current           1 Term 09/06/18 [redacted]w[redacted]d 02:22 / 00:24 3070 g F Vag-Spont None N LIV    Social History   Socioeconomic History  . Marital status: Single    Spouse name: Kirtland Bouchard  . Number of children: 1  . Years of education: Not on file  . Highest education level: Not on file   Occupational History  . Not on file  Tobacco Use  . Smoking status: Never Smoker  . Smokeless tobacco: Never Used  Vaping Use  . Vaping Use: Former  Substance and Sexual Activity  . Alcohol use: No  . Drug use: No  . Sexual activity: Yes    Birth control/protection: None  Other Topics Concern  . Not on file  Social History Narrative  . Not on file   Social Determinants of Health   Financial Resource Strain: Low Risk   . Difficulty of Paying Living Expenses: Not hard at all  Food Insecurity: Food Insecurity Present  . Worried About Charity fundraiser in the Last Year: Sometimes true  . Ran Out of Food in the Last Year: Never true  Transportation Needs: No Transportation Needs  . Lack of Transportation (Medical): No  . Lack of Transportation (Non-Medical): No  Physical Activity: Insufficiently Active  . Days of Exercise per Week: 2 days  . Minutes of Exercise per Session: 20 min  Stress: No Stress Concern Present  . Feeling of Stress : Only a little  Social Connections: Moderately Integrated  . Frequency of Communication with Friends and Family: Twice a week  . Frequency of Social Gatherings with Friends and Family: Once a week  . Attends Religious Services: 1 to 4 times per year  . Active Member of Clubs or Organizations: No  . Attends Archivist Meetings: Never  . Marital Status: Living with partner  Family History  Problem Relation Age of Onset  . Diabetes Father     Medications Prior to Admission  Medication Sig Dispense Refill Last Dose  . Blood Pressure Monitor MISC For regular home bp monitoring during pregnancy 1 each 0 08/31/2020 at Unknown time  . ferrous sulfate 325 (65 FE) MG tablet Take 1 tablet (325 mg total) by mouth 2 (two) times daily with a meal. 60 tablet 3 08/31/2020 at Unknown time  . Prenatal 27-1 MG TABS Take by mouth daily.   08/31/2020 at Unknown time  . Prenatal Vit-Iron Carbonyl-FA (PRENATAL PLUS IRON) 29-1 MG TABS Take 1  tablet by mouth daily. 30 tablet 12 08/31/2020 at Unknown time  . nitrofurantoin, macrocrystal-monohydrate, (MACROBID) 100 MG capsule Take 1 capsule (100 mg total) by mouth at bedtime. Begin once you have completed 10 days of Bactrim 90 capsule 0     No Known Allergies  Review of Systems: Negative except for what is mentioned in HPI.  Physical Exam: BP 110/64 (BP Location: Right Arm)   Pulse 60   Temp 98.2 F (36.8 C) (Oral)   Resp 18   LMP 11/27/2019   SpO2 100%  CONSTITUTIONAL: Well-developed, well-nourished female in no acute distress.  HENT:  Normocephalic, atraumatic, External right and left ear normal.  EYES: Conjunctivae and EOM are normal. Pupils are equal, round, and reactive to light. No scleral icterus.  NECK: Normal range of motion, supple, no masses SKIN: Skin is warm and dry. No rash noted. Not diaphoretic. No erythema. No pallor. NEUROLOGIC: Alert and oriented to person, place, and time. Normal reflexes, muscle tone coordination. No cranial nerve deficit noted. PSYCHIATRIC: Normal mood and affect. Normal behavior. Normal judgment and thought content. CARDIOVASCULAR: Normal heart rate noted, regular rhythm RESPIRATORY: Effort and breath sounds normal, no problems with respiration noted ABDOMEN: Soft, nontender, nondistended, gravid. MUSCULOSKELETAL: Normal range of motion. No edema and no tenderness. 2+ distal pulses.  Cervical Exam: Dilatation 10cm   Effacement 100%   Station +3 by Hansel Feinstein, CNM   Presentation: cephalic FHT:  Baseline rate 145 bpm   Variability moderate  Accelerations present   Decelerations early Contractions: Every 4-5 mins   Pertinent Labs/Studies:   Results for orders placed or performed during the hospital encounter of 09/01/20 (from the past 24 hour(s))  Type and screen     Status: None   Collection Time: 09/01/20  6:05 AM  Result Value Ref Range   ABO/RH(D) O POS    Antibody Screen NEG    Sample Expiration       09/04/2020,2359 Performed at [facility], [place] [address] [place], [place] [address]   Respiratory Panel by RT PCR (Flu A&B, Covid) - Nasopharyngeal Swab     Status: None   Collection Time: 09/01/20  6:27 AM   Specimen: Nasopharyngeal Swab  Result Value Ref Range   SARS Coronavirus 2 by RT PCR NEGATIVE NEGATIVE   Influenza A by PCR NEGATIVE NEGATIVE   Influenza B by PCR NEGATIVE NEGATIVE    Assessment : Lindsay Blackwell is a 19 y.o. G2P1001 at [redacted]w[redacted]d being admitted in second stage  Plan: Labor: To L&D for imminent vaginal delivery FWB: Reassuring fetal heart tracing.  GBS negative   Verita Schneiders, MD, [place] for [employer], [place]

## 2020-09-01 NOTE — Discharge Summary (Signed)
Postpartum Discharge Summary      Patient Name: Lindsay Blackwell DOB: [birth year] MRN: [id number]  Date of admission: 09/01/2020 Delivery date:09/01/2020  Delivering provider: Seabron Spates  Date of discharge: 09/02/2020  Admitting diagnosis: Post term pregnancy [O48.0] Vaginal delivery [O80] Intrauterine pregnancy: [redacted]w[redacted]d    Secondary diagnosis:  Active Problems:   Vaginal delivery  Additional problems: neg    Discharge diagnosis: Term Pregnancy Delivered                                              Post partum procedures:Nexplanon placement on PPD#1 Augmentation: none Complications: None  Hospital course: Onset of Labor With Vaginal Delivery      19y.o. yo GO2V0350at 32w6das admitted in Active Labor on 09/01/2020. Patient had an uncomplicated labor course, delivering precipitously in MAU as follows:  Membrane Rupture Time/Date: 5:59 AM ,09/01/2020   Delivery Method:Vaginal, Spontaneous  Episiotomy: None  Lacerations:  Vaginal;2nd degree  Patient had an uncomplicated postpartum course.  She is ambulating, tolerating a regular diet, passing flatus, and urinating well. Nexplanon placed on PPD#1. Patient is discharged home in stable condition on 09/02/20 per her request for early d/c if the baby can go as well.  Newborn Data: Birth date:09/01/2020  Birth time:6:00 AM  Gender:Female  Living status:Living  Apgars:8 ,9  Weight:3550 g   Magnesium Sulfate received: No BMZ received: No Rhophylac:N/A MMR:N/A T-DaP:Given prenatally Flu: No Transfusion:No  Physical exam  Vitals:   09/01/20 0800 09/01/20 1127 09/01/20 1345 09/01/20 1730  BP: 110/64 97/63 118/78 121/83  Pulse: 60 69 64 (!) 59  Resp: 18 18 16 16   Temp:  98.1 F (36.7 C)    TempSrc:  Oral    SpO2:   99% 99%   General: alert, cooperative and no distress Lochia: appropriate Uterine Fundus: firm Incision: N/A DVT Evaluation: No evidence of DVT seen on physical exam. Negative Homan's  sign. No cords or calf tenderness. No significant calf/ankle edema. Labs: Lab Results  Component Value Date   WBC 8.9 09/01/2020   HGB 12.5 09/01/2020   HCT 37.4 09/01/2020   MCV 87.4 09/01/2020   PLT 132 (L) 09/01/2020   CMP Latest Ref Rng & Units 06/05/2020  Glucose 70 - 99 mg/dL 103(H)  BUN 6 - 20 mg/dL 13  Creatinine 0.44 - 1.00 mg/dL 0.78  Sodium 135 - 145 mmol/L 135  Potassium 3.5 - 5.1 mmol/L 3.7  Chloride 98 - 111 mmol/L 103  CO2 22 - 32 mmol/L 24  Calcium 8.9 - 10.3 mg/dL 8.4(L)  Total Protein 6.5 - 8.1 g/dL 6.2(L)  Total Bilirubin 0.3 - 1.2 mg/dL 0.4  Alkaline Phos 38 - 126 U/L 84  AST 15 - 41 U/L 15  ALT 0 - 44 U/L 7   Edinburgh Score: Edinburgh Postnatal Depression Scale Screening Tool 09/01/2020  I have been able to laugh and see the funny side of things. 0  I have looked forward with enjoyment to things. 1  I have blamed myself unnecessarily when things went wrong. 1  I have been anxious or worried for no good reason. 0  I have felt scared or panicky for no good reason. 0  Things have been getting on top of me. 1  I have been so unhappy that I have had difficulty sleeping. 0  I have felt sad  or miserable. 0  I have been so unhappy that I have been crying. 0  The thought of harming myself has occurred to me. 0  Edinburgh Postnatal Depression Scale Total 3     After visit meds:  Allergies as of 09/02/2020   No Known Allergies     Medication List    STOP taking these medications   Blood Pressure Monitor Misc   ferrous sulfate 325 (65 FE) MG tablet   nitrofurantoin (macrocrystal-monohydrate) 100 MG capsule Commonly known as: Macrobid   Prenatal 27-1 MG Tabs     TAKE these medications   ibuprofen 600 MG tablet Commonly known as: ADVIL Take 1 tablet (600 mg total) by mouth every 6 (six) hours.   Prenatal Plus Iron 29-1 MG Tabs Take 1 tablet by mouth daily.        Discharge home in stable condition Infant Feeding: Breast Infant  Disposition:home with mother Discharge instruction: per After Visit Summary and Postpartum booklet. Activity: Advance as tolerated. Pelvic rest for 6 weeks.  Diet: routine diet Future Appointments: Future Appointments  Date Time Provider [place]  10/06/2020 10:50 AM Cresenzo-Dishmon, Joaquim Lai, Lindsay [place]well [facility]   Follow up Visit:  Follow-up Information    [facility] Follow up on 10/06/2020.   Specialty: Obstetrics and Gynecology Why: for postpartum checkup Contact information: [address] [place] [phone]              Lindsay Blackwell, Lindsay Blackwell  Lindsay Blackwell Please schedule this patient for Postpartum visit in: 4 weeks with the following provider: Any provider  Pt preference (in person vs virtual)  For C/S patients schedule nurse incision check in weeks 2 weeks: no  Low risk pregnancy complicated by: none  Delivery mode: SVD  Anticipated Birth Control:  PP Nex placed  PP Procedures needed: none  Schedule Integrated BH visit: no      09/02/2020 Lindsay Blackwell, Lindsay Blackwell

## 2020-09-02 DIAGNOSIS — Z30017 Encounter for initial prescription of implantable subdermal contraceptive: Secondary | ICD-10-CM

## 2020-09-02 LAB — RPR: RPR Ser Ql: NONREACTIVE

## 2020-09-02 MED ORDER — ETONOGESTREL 68 MG ~~LOC~~ IMPL
68.0000 mg | DRUG_IMPLANT | Freq: Once | SUBCUTANEOUS | Status: AC
  Administered 2020-09-02: 68 mg via SUBCUTANEOUS
  Filled 2020-09-02: qty 1

## 2020-09-02 MED ORDER — LIDOCAINE HCL 1 % IJ SOLN
0.0000 mL | Freq: Once | INTRAMUSCULAR | Status: AC | PRN
  Administered 2020-09-02: 20 mL via INTRADERMAL
  Filled 2020-09-02: qty 20

## 2020-09-02 MED ORDER — IBUPROFEN 600 MG PO TABS: 600.0000 mg | ORAL_TABLET | Freq: Four times a day (QID) | ORAL | 0 refills | Status: DC

## 2020-09-02 NOTE — Procedures (Signed)
Post-Placental Nexplanon Insertion Procedure Note  Patient was identified. Informed consent was signed, signed copy in chart. A time-out was performed.    The insertion site was identified 8-10 cm (3-4 inches) from the medial epicondyle of the humerus and 3-5 cm (1.25-2 inches) posterior to (below) the sulcus (groove) between the biceps and triceps muscles of the patient's left arm and marked. The site was prepped and draped in the usual sterile fashion. Pt was prepped with alcohol swab and then injected with 6 cc of 1% lidocaine. The site was prepped with betadine. Nexplanon removed form packaging,  Device confirmed in needle, then inserted full length of needle and withdrawn per handbook instructions. Provider and patient verified presence of the implant in the woman's arm by palpation. Pt insertion site was covered with steristrips/adhesive bandage and pressure bandage. There was minimal blood loss. Patient tolerated procedure well.  Patient was given post procedure instructions and Nexplanon user card with expiration date. Condoms were recommended for STI prevention. Patient was asked to keep the pressure dressing on for 24 hours to minimize bruising and keep the adhesive bandage on for 3-5 days. The patient verbalized understanding of the plan of care and agrees.   Lot # [id number] Expiration Date01/14/24  Myrtie Cruise, MD PGY-1 09/02/20

## 2020-09-02 NOTE — Lactation Note (Signed)
This note was copied from a baby's chart. Lactation Consultation Note  Patient Name: Lindsay Blackwell [id number] Date: 09/02/2020  Baby Lindsay Amstutz now 38 hours old.  Mom wants to be discharged today.  Mom reports she wanted to breastfeed with her first but it did not go so well. Mom reports she pumped for about 2 months and ran out of milk. Mom reports she is worried he is not getting enough. Mom reports nipple soreness.  [place] asked mom if could observe nipples.  Left nipple as compression stripe and is reddened. Asked mom if she knew how to hand express.  Mom reported yes.  Urged mom to hand express and spoon feed past breastfeeding and feed back all expressed mothers milk past breastfeeding.   Mom reports it has been about an hour and a half since he breastfed.  Asked mom if we could try and wake him up to feed.  Mom agreed. Gave mom manual hand pump and urged her to prepump. Showed her how to prepump. She saw how it everted her nipple more. Assisted in laying mom back slightly and unswaddling him to get him in close.   Showed mom how to get him in tummy to mommy and do a gentle chin tug to get him in closer.  Mom reports comfort.  Assisted with hand expression and spoon feeding once he let go.  Gave him approximately 5 ml via spoon and then he was cuing to go back.  Minimal assist with putting him on moms right.  Mom reports comfort. Praised breastfeedingf efforts.  Mom has [facility] consultation for home use.    Maternal Data    Feeding    LATCH Score                   Interventions    Lactation Tools Discussed/Used     Consult Status      Exodus Kutzer Thompson Caul 09/02/2020, 4:31 PM

## 2020-09-05 ENCOUNTER — Telehealth: Payer: Self-pay

## 2020-09-05 ENCOUNTER — Other Ambulatory Visit: Payer: Medicaid Other | Admitting: Obstetrics & Gynecology

## 2020-09-05 NOTE — Telephone Encounter (Signed)
Transition Care Management Unsuccessful Follow-up Telephone Call  Date of discharge and from where:  [facility] 09[facility]02/[facility]  Attempts:  1st Attempt  Reason for unsuccessful TCM follow-up call:  Unable to leave message

## 2020-09-09 ENCOUNTER — Inpatient Hospital Stay (HOSPITAL_COMMUNITY): Payer: Medicaid Other

## 2020-09-09 ENCOUNTER — Inpatient Hospital Stay (HOSPITAL_COMMUNITY)
Admission: AD | Admit: 2020-09-09 | Payer: Medicaid Other | Source: Home / Self Care | Admitting: Obstetrics & Gynecology

## 2020-10-06 ENCOUNTER — Ambulatory Visit (INDEPENDENT_AMBULATORY_CARE_PROVIDER_SITE_OTHER): Payer: Medicaid Other | Admitting: Advanced Practice Midwife

## 2020-10-06 ENCOUNTER — Other Ambulatory Visit: Payer: Self-pay

## 2020-10-06 ENCOUNTER — Encounter: Payer: Self-pay | Admitting: Advanced Practice Midwife

## 2020-10-06 DIAGNOSIS — Z975 Presence of (intrauterine) contraceptive device: Secondary | ICD-10-CM | POA: Insufficient documentation

## 2020-10-06 DIAGNOSIS — Z8759 Personal history of other complications of pregnancy, childbirth and the puerperium: Secondary | ICD-10-CM

## 2020-10-06 NOTE — Progress Notes (Signed)
[place] Partum Visit Note  Lindsay Blackwell is a 19 y.o. G73P2002 female who presents for a postpartum visit. She is 5 weeks postpartum following a normal spontaneous vaginal delivery.  I have fully reviewed the prenatal and intrapartum course. The delivery was at 39.6 gestational weeks.  Anesthesia: none. Postpartum course has been uneventful. Baby is doing well. Baby is feeding by stopped BF 2 days ago, now on [employer]. Bleeding Bleeding some, got Nexplaon inpt. Bowel function is normal. Bladder function is normal. Patient is not sexually active. Contraception method is Nexplanon. Postpartum depression screening: negative.    Review of Systems Pertinent items are noted in HPI.   Objective:  Blood pressure 102/68, pulse 66, height 5\' 3"  (1.6 m), weight 131 lb (59.4 kg), last menstrual period 10/04/2020, not currently breastfeeding.  General:  alert, cooperative, and no distress   Breasts:  negative  Lungs: Normal respiratory effort  Heart:  regular rate and rhythm  Abdomen: soft, non-tender,    Vulva:  normal  Vagina: not evaluated  Cervix:  normal  Corpus: Well involuted  Adnexa:  not evaluated  Rectal Exam: no hemorrhoids        Assessment:    normal postpartum exam. Pap smear not done at today's visit.   Plan:   Essential components of care per ACOG recommendations:  1.  Mood and well being: Patient with negative depression screening today. Reviewed local resources for support.  - Patient does not use tobacco. If using tobacco we discussed reduction and for recently cessation risk of relapse - hx of drug use? No   If yes, discussed support systems  2. Infant care and feeding:  -Patient currently breastmilk feeding? No If breastmilk feeding discussed return to work and pumping. If needed, patient was provided letter for work to allow for every 2-3 hr pumping breaks, and to be granted a private location to express breastmilk and refrigerated area to store breastmilk.  Reviewed importance of draining breast regularly to support lactation. -Social determinants of health (SDOH) reviewed in EPIC. No concerns  3. Sexuality, contraception and birth spacing - Patient does not want a pregnancy in the next year.  Desired family size is I don't know how many children.  - Reviewed forms of contraception in tiered fashion. Patient desired in hospital Nexplanon today.   - Discussed birth spacing of 18 months  4. Sleep and fatigue -Encouraged family/partner/community support of 4 hrs of uninterrupted sleep to help with mood and fatigue  5. Physical Recovery  - Discussed patients delivery and complications - Patient had a 2 degree laceration, perineal healing reviewed. Patient expressed understanding - Patient has urinary incontinence? No Patient was referred to pelvic floor PT  - Patient is safe to resume physical and sexual activity  6.  Health Maintenance - Last pap smear done 08/17/20 and was abnormal with LSIL, neg HPV with negative HPV. Repeat 1 year    Christin Fudge, [place] for [employer], [place]

## 2020-12-22 ENCOUNTER — Ambulatory Visit: Payer: Medicaid Other | Admitting: Adult Health

## 2020-12-22 ENCOUNTER — Other Ambulatory Visit: Payer: Self-pay

## 2020-12-22 ENCOUNTER — Encounter: Payer: Self-pay | Admitting: Adult Health

## 2020-12-22 VITALS — BP 106/70 | HR 100 | Ht 63.0 in | Wt 123.0 lb

## 2020-12-22 DIAGNOSIS — K649 Unspecified hemorrhoids: Secondary | ICD-10-CM | POA: Insufficient documentation

## 2020-12-22 DIAGNOSIS — K602 Anal fissure, unspecified: Secondary | ICD-10-CM

## 2020-12-22 MED ORDER — HYDROCORTISONE ACETATE 25 MG RE SUPP: 25.0000 mg | Freq: Two times a day (BID) | RECTAL | 99 refills | Status: AC | PRN

## 2020-12-22 MED ORDER — DOCUSATE SODIUM 100 MG PO CAPS: 100.0000 mg | ORAL_CAPSULE | Freq: Every day | ORAL | 99 refills | Status: AC | PRN

## 2020-12-22 NOTE — Progress Notes (Signed)
  Subjective:     Patient ID: Lindsay Blackwell, female   DOB: February 03, 2001, 20 y.o.   MRN: [id number]  HPI Lindsay Blackwell is a 20 year old Hispanic female, single, G2P2 in complaining of bump in rectal area, it hurts at times and may see blood when wipes, has noticed about 3 days now. PCP is Dr Lanny Cramp.   Review of Systems Has bump in rectal area, it hurts at times and may see blood if wipes BMs a little hard Reviewed past medical,surgical, social and family history. Reviewed medications and allergies.     Objective:   Physical Exam BP 106/70 (BP Location: Right Arm, Patient Position: Sitting, Cuff Size: Normal)   Pulse 100   Ht 5\' 3"  (1.6 m)   Wt 123 lb (55.8 kg)   Breastfeeding No   BMI 21.79 kg/m    Skin warm and dry. On rectal visual, has small hemorrhoid and fissure at about 12 0'clock Examination chaperoned by Glenard Haring RN Fall risk is low    Upstream - 12/22/20 1602      Pregnancy Intention Screening   Does the patient want to become pregnant in the next year? No    Does the patient's partner want to become pregnant in the next year? No    Would the patient like to discuss contraceptive options today? No      Contraception Wrap Up   Current Method Hormonal Implant    End Method Hormonal Implant    Contraception Counseling Provided No          Assessment:     1. Rectal fissure Will rx colace and Anusol supp Use warm compresses or warm tub bath  2. Hemorrhoids, unspecified hemorrhoid type Use Anusol supp or preparation H Take colace and try not to strain     Meds ordered this encounter  Medications  . docusate sodium (COLACE) 100 MG capsule    Sig: Take 1 capsule (100 mg total) by mouth daily as needed for mild constipation.    Dispense:  30 capsule    Refill:  prn    Order Specific Question:   Supervising Provider    Answer:   Elonda Husky, LUTHER H [[id number]]  . hydrocortisone (ANUSOL-HC) 25 MG suppository    Sig: Place 1 suppository (25 mg total) rectally 2 (two) times  daily as needed for hemorrhoids or anal itching.    Dispense:  12 suppository    Refill:  prn    Order Specific Question:   Supervising Provider    Answer:   Florian Buff [[id number]]   Plan:     Review handouts on rectal fissure and hemorrhoids Follow up prn

## 2020-12-22 NOTE — Patient Instructions (Signed)
Anal Fissure, Adult  An anal fissure is a small tear or crack in the tissue around the opening of the butt (anus). Bleeding from the tear or crack usually stops on its own within a few minutes. The bleeding may happen every time you poop (have a bowel movement) until the tear or crack heals. What are the causes? This condition is usually caused by passing a large or hard poop (stool). Other causes include:  Trouble pooping (constipation).  Passing watery poop (diarrhea).  Inflammatory bowel disease (Crohn's disease or ulcerative colitis).  Childbirth.  Infections.  Anal sex. What are the signs or symptoms? Symptoms of this condition include:  Bleeding from the butt.  Small amounts of blood on your poop. The blood coats the outside of the poop. It is not mixed with the poop.  Small amounts of blood on the toilet paper or in the toilet after you poop.  Pain when passing poop.  Itching or irritation around the opening of the butt. How is this diagnosed? This condition may be diagnosed based on a physical exam. Your doctor may:  Check your butt. A tear can often be seen by checking the area with care.  Check your butt using a short tube (anoscope). The light in the tube will show any problems in your butt. How is this treated? Treatment for this condition may include:  Treating problems that make it hard for you to pass poop. You may be told to: ? Eat more fiber. ? Drink more fluid. ? Take fiber supplements. ? Take medicines that make poop soft.  Taking sitz baths. This may help to heal the tear.  Using creams and ointments. If your condition gets worse, other treatments may be needed such as:  A shot near the tear or crack (botulinum injection).  Surgery to repair the tear or crack. Follow these instructions at home: Eating and drinking  Avoid bananas and dairy products. These foods can make it hard to poop.  Drink enough fluid to keep your pee (urine) pale  yellow.  Eat foods that have a lot of fiber in them, such as: ? Beans. ? Whole grains. ? Fresh fruits. ? Fresh vegetables.   General instructions  Take over-the-counter and prescription medicines only as told by your doctor.  Use creams or ointments only as told by your doctor.  Keep the butt area as clean and dry as you can.  Take a warm water bath (sitz bath) as told by your doctor. Do not use soap.  Keep all follow-up visits as told by your doctor. This is important.   Contact a doctor if:  You have more bleeding.  You have a fever.  You have watery poop that is mixed with blood.  You have pain.  Your problem gets worse, not better. Summary  An anal fissure is a small tear or crack in the skin around the opening of the butt (anus).  This condition is usually caused by passing a large or hard poop (stool).  Treatment includes treating the problems that make it hard for you to pass poop.  Follow your doctor's instructions about caring for your condition at home.  Keep all follow-up visits as told by your doctor. This is important. This information is not intended to replace advice given to you by your health care provider. Make sure you discuss any questions you have with your health care provider. Document Revised: 04/03/2018 Document Reviewed: 04/03/2018 Elsevier Patient Education  2021 [place]. Hemorrhoids Hemorrhoids  are swollen veins that may develop:  In the butt (rectum). These are called internal hemorrhoids.  Around the opening of the butt (anus). These are called external hemorrhoids. Hemorrhoids can cause pain, itching, or bleeding. Most of the time, they do not cause serious problems. They usually get better with diet changes, lifestyle changes, and other home treatments. What are the causes? This condition may be caused by:  Having trouble pooping (constipation).  Pushing hard (straining) to poop.  Watery poop  (diarrhea).  Pregnancy.  Being very overweight (obese).  Sitting for long periods of time.  Heavy lifting or other activity that causes you to strain.  Anal sex.  Riding a bike for a long period of time. What are the signs or symptoms? Symptoms of this condition include:  Pain.  Itching or soreness in the butt.  Bleeding from the butt.  Leaking poop.  Swelling in the area.  One or more lumps around the opening of your butt. How is this diagnosed? A doctor can often diagnose this condition by looking at the affected area. The doctor may also:  Do an exam that involves feeling the area with a gloved hand (digital rectal exam).  Examine the area inside your butt using a small tube (anoscope).  Order blood tests. This may be done if you have lost a lot of blood.  Have you get a test that involves looking inside the colon using a flexible tube with a camera on the end (sigmoidoscopy or colonoscopy). How is this treated? This condition can usually be treated at home. Your doctor may tell you to change what you eat, make lifestyle changes, or try home treatments. If these do not help, procedures can be done to remove the hemorrhoids or make them smaller. These may involve:  Placing rubber bands at the base of the hemorrhoids to cut off their blood supply.  Injecting medicine into the hemorrhoids to shrink them.  Shining a type of light energy onto the hemorrhoids to cause them to fall off.  Doing surgery to remove the hemorrhoids or cut off their blood supply. Follow these instructions at home: Eating and drinking  Eat foods that have a lot of fiber in them. These include whole grains, beans, nuts, fruits, and vegetables.  Ask your doctor about taking products that have added fiber (fibersupplements).  Reduce the amount of fat in your diet. You can do this by: ? Eating low-fat dairy products. ? Eating less red meat. ? Avoiding processed foods.  Drink enough fluid  to keep your pee (urine) pale yellow.   Managing pain and swelling  Take a warm-water bath (sitz bath) for 20 minutes to ease pain. Do this 3-4 times a day. You may do this in a bathtub or using a portable sitz bath that fits over the toilet.  If told, put ice on the painful area. It may be helpful to use ice between your warm baths. ? Put ice in a plastic bag. ? Place a towel between your skin and the bag. ? Leave the ice on for 20 minutes, 2-3 times a day.   General instructions  Take over-the-counter and prescription medicines only as told by your doctor. ? Medicated creams and medicines may be used as told.  Exercise often. Ask your doctor how much and what kind of exercise is best for you.  Go to the bathroom when you have the urge to poop. Do not wait.  Avoid pushing too hard when you poop.  Keep  your butt dry and clean. Use wet toilet paper or moist towelettes after pooping.  Do not sit on the toilet for a long time.  Keep all follow-up visits as told by your doctor. This is important. Contact a doctor if you:  Have pain and swelling that do not get better with treatment or medicine.  Have trouble pooping.  Cannot poop.  Have pain or swelling outside the area of the hemorrhoids. Get help right away if you have:  Bleeding that will not stop. Summary  Hemorrhoids are swollen veins in the butt or around the opening of the butt.  They can cause pain, itching, or bleeding.  Eat foods that have a lot of fiber in them. These include whole grains, beans, nuts, fruits, and vegetables.  Take a warm-water bath (sitz bath) for 20 minutes to ease pain. Do this 3-4 times a day. This information is not intended to replace advice given to you by your health care provider. Make sure you discuss any questions you have with your health care provider. Document Revised: 10/30/2018 Document Reviewed: 03/13/2018 Elsevier Patient Education  [place].

## 2021-01-20 ENCOUNTER — Ambulatory Visit: Payer: Medicaid Other | Admitting: Obstetrics & Gynecology

## 2021-01-20 ENCOUNTER — Encounter: Payer: Self-pay | Admitting: Obstetrics & Gynecology

## 2021-01-20 ENCOUNTER — Other Ambulatory Visit: Payer: Self-pay

## 2021-01-20 VITALS — BP 127/88 | HR 73 | Ht 63.0 in | Wt 119.4 lb

## 2021-01-20 DIAGNOSIS — Z30013 Encounter for initial prescription of injectable contraceptive: Secondary | ICD-10-CM

## 2021-01-20 DIAGNOSIS — N939 Abnormal uterine and vaginal bleeding, unspecified: Secondary | ICD-10-CM

## 2021-01-20 DIAGNOSIS — Z3046 Encounter for surveillance of implantable subdermal contraceptive: Secondary | ICD-10-CM

## 2021-01-20 MED ORDER — MEDROXYPROGESTERONE ACETATE 150 MG/ML IM SUSP: 150.0000 mg | INTRAMUSCULAR | 4 refills | Status: AC

## 2021-01-20 MED ORDER — MEDROXYPROGESTERONE ACETATE 150 MG/ML IM SUSY: PREFILLED_SYRINGE | Freq: Once | INTRAMUSCULAR | Status: AC

## 2021-01-20 NOTE — Progress Notes (Signed)
   GYN VISIT Patient name: Lindsay Blackwell MRN [id number]  Date of birth: Nov 14, 2000 Chief Complaint:   Menstrual Problem  History of Present Illness:   Lindsay Blackwell is a 20 y.o. [phone] female being seen today for bleeding concerns:  AUB: Today she notes that she has been bleeding since delivery.  Start as BRB x 1week then brown for a few hrs then back to bleeding.  Super tampon 2-3x per day and has to do this every day.  She has had irregular bleeding with Nexplanon in the past, but never to this extent.  No itching, no discharge or odor.  No pelvic or abodminal pain- only mild cramping.  No other acute complaints  Nexplanon placed- 08/2020 immediately postpartum  No LMP recorded (lmp unknown). Patient has had an implant.  Depression screen [facility] 2/9 02/24/2020 03/03/2018  Decreased Interest 2 0  Down, Depressed, Hopeless 1 1  PHQ - 2 Score 3 1  Altered sleeping 2 1  Tired, decreased energy 2 1  Change in appetite 2 0  Feeling bad or failure about yourself  1 0  Trouble concentrating 1 0  Moving slowly or fidgety/restless 0 0  Suicidal thoughts 0 0  PHQ-9 Score 11 3  Difficult doing work/chores Somewhat difficult -     Review of Systems:   Pertinent items are noted in HPI Denies fever/chills, dizziness, headaches, visual disturbances, fatigue, shortness of breath, chest pain, abdominal pain, vomiting, bowel movements, urination, or intercourse unless otherwise stated above.  Pertinent History Reviewed:  Reviewed past medical,surgical, social, obstetrical and family history.  Reviewed problem list, medications and allergies. Physical Assessment:   Vitals:   01/20/21 0902  BP: 127/88  Pulse: 73  Weight: 119 lb 6.4 oz (54.2 kg)  Height: 5\' 3"  (1.6 m)  Body mass index is 21.15 kg/m.       Physical Examination:   General appearance: alert, well appearing, and in no distress  Psych: mood appropriate, normal affect  Skin: warm & dry   Cardiovascular: normal  heart rate noted  Respiratory: normal respiratory effort, no distress  Abdomen: soft, non-tender   Pelvic: deferred  Extremities: left arm with palpable device   Chaperone: N/A    Nexplanon removal:   Time out was performed.  Nexplanon site identified.  Area prepped in usual sterile fashon. One cc of 2% lidocaine was used to anesthetize the area at the distal end of the implant. A small stab incision was made right beside the implant on the distal portion.  The Nexplanon rod was grasped using hemostats and removed without difficulty.  There was less than 3 cc blood loss. There were no complications.  Steri-strips were applied over the small incision and a pressure bandage was applied.  The patient tolerated the procedure well.   Assessment & Plan:  1) AUB due to Nexplanon: discussed management options including addition of pill vs transition to other form of contraception.  Reviewed risk/benefit of each option- desires removal today and transition to Depot -see above procedure note- Nexplanon removed today -Depot given, f/u every 3 mos  No orders of the defined types were placed in this encounter.   Return in about 3 months (around 04/22/2021) for Needs Depot shot every 3 mos.   Janyth Pupa, DO Attending [place], [facility] for [employer], [place]

## 2021-01-25 IMAGING — US US RENAL
1 series · 15 of 25 positions shown · non-contrast
Comparison: None.

CLINICAL DATA: Right-sided flank pain

EXAM:
RENAL / URINARY TRACT ULTRASOUND COMPLETE

[Series 1: us renal · 37 acquisitions, 15 frames shown]
[im 1/37]
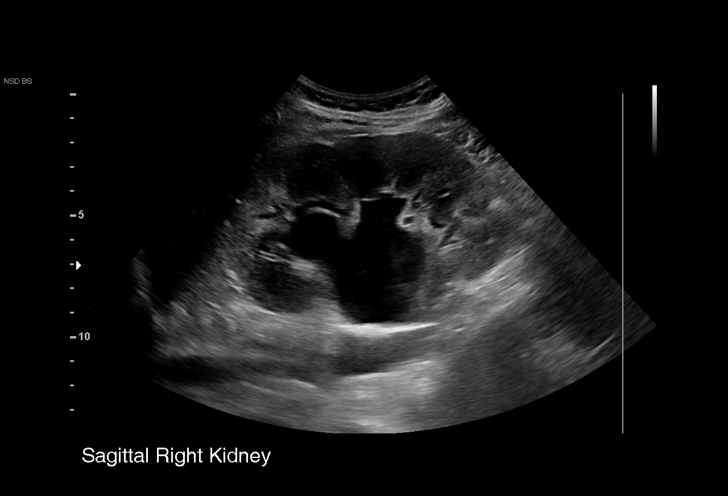
[im 4/37]
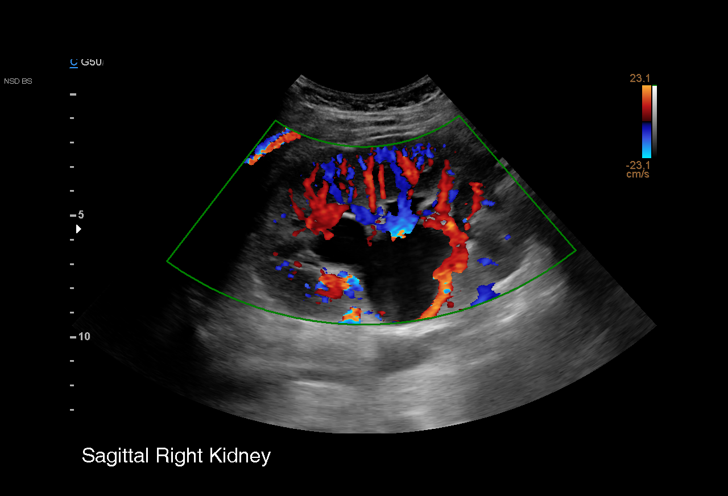
[im 7/37]
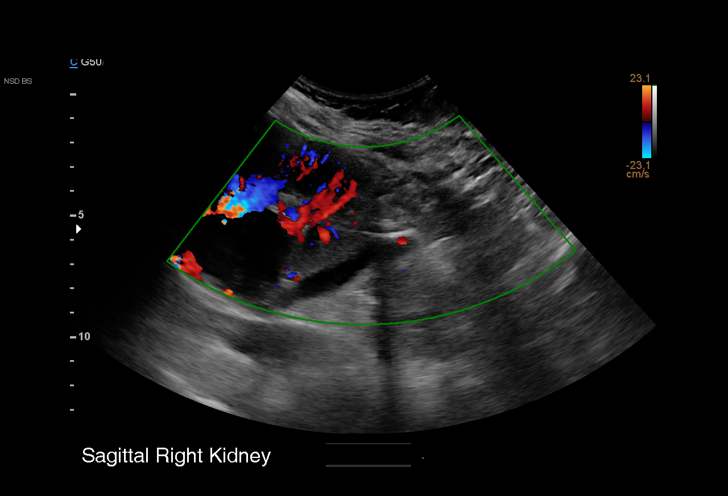
[im 8/37]
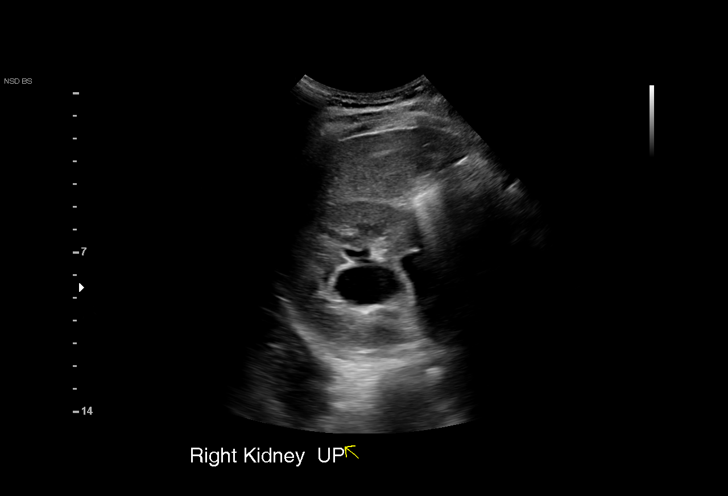
[im 11/37]
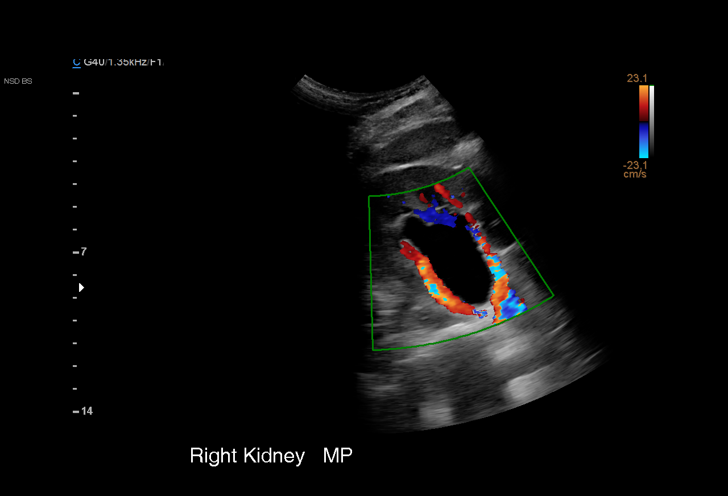
[im 14/37]
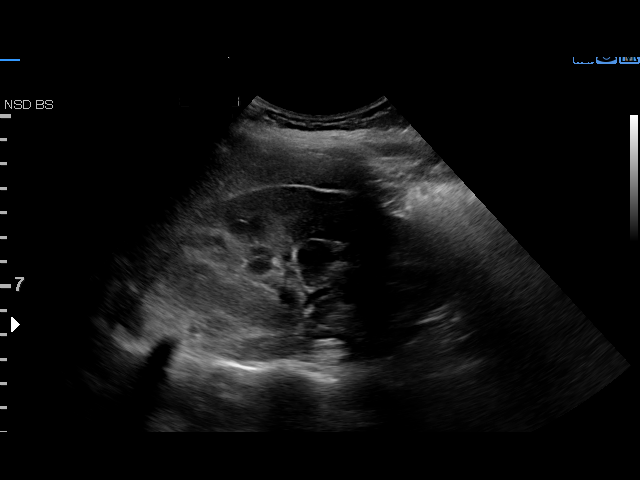
[im 16/37]
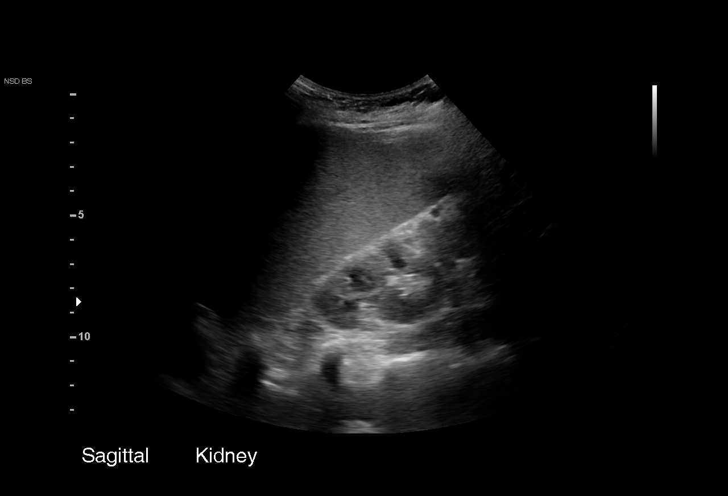
[im 19/37]
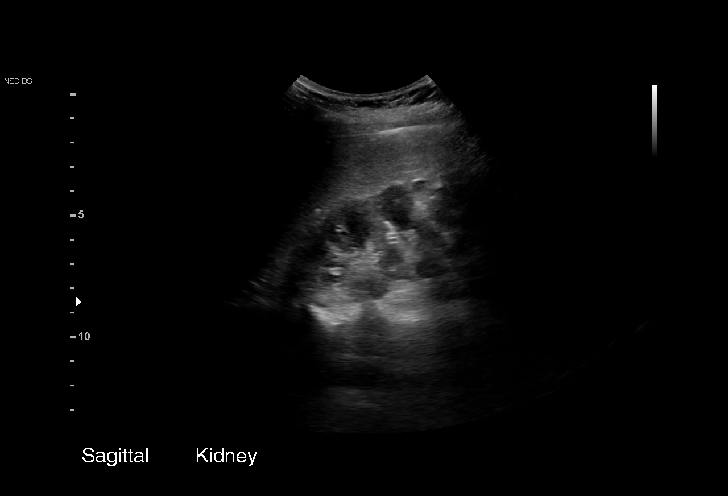
[im 22/37]
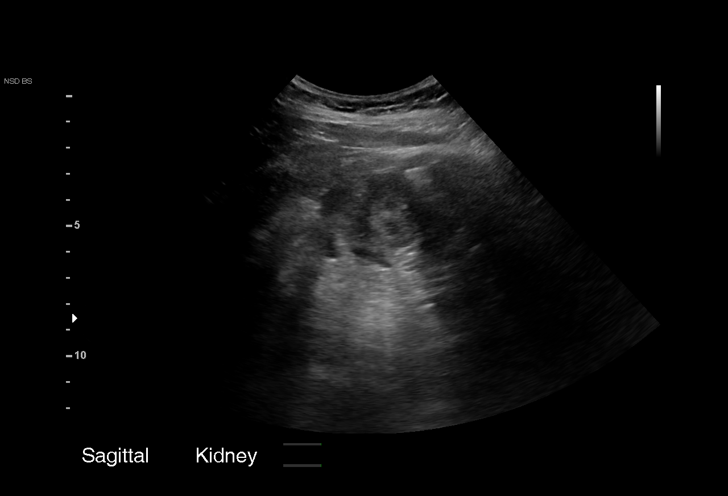
[im 23/37]
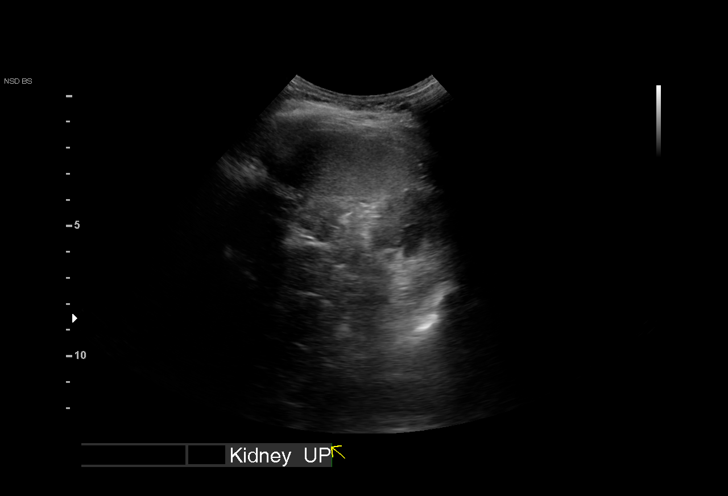
[im 26/37]
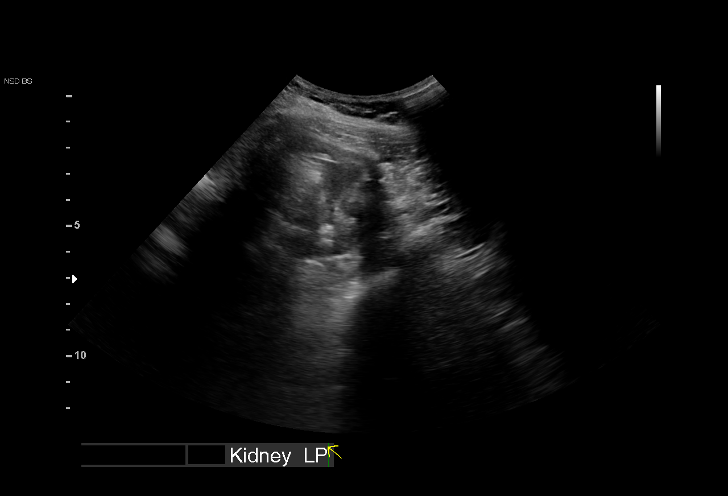
[im 29/37]
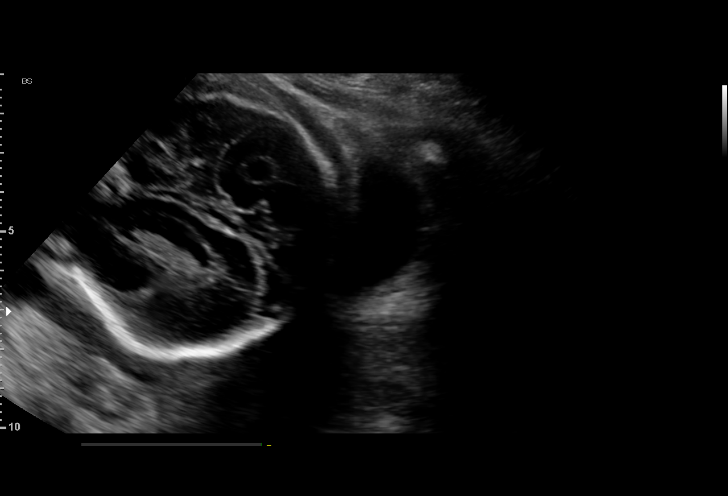
[im 31/37]
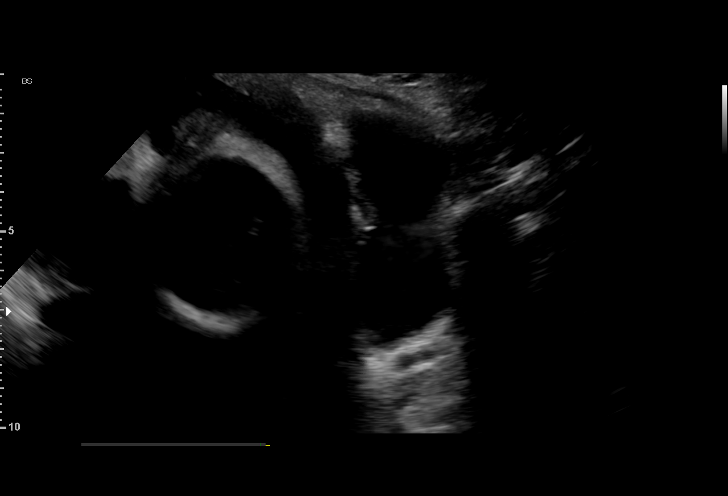
[im 34/37]
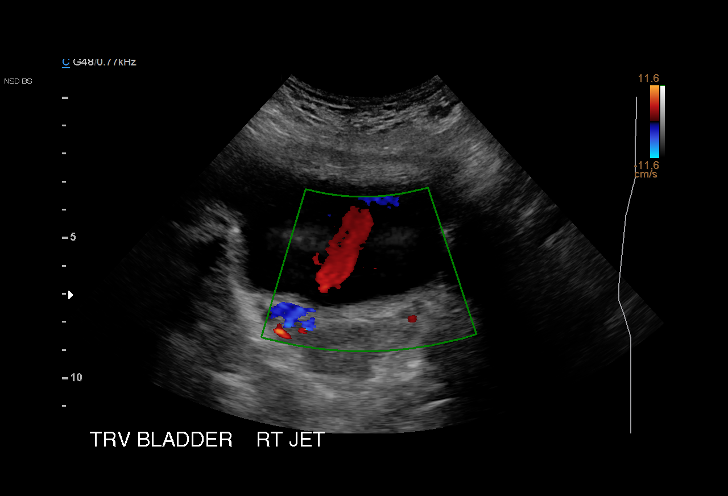
[im 37/37]
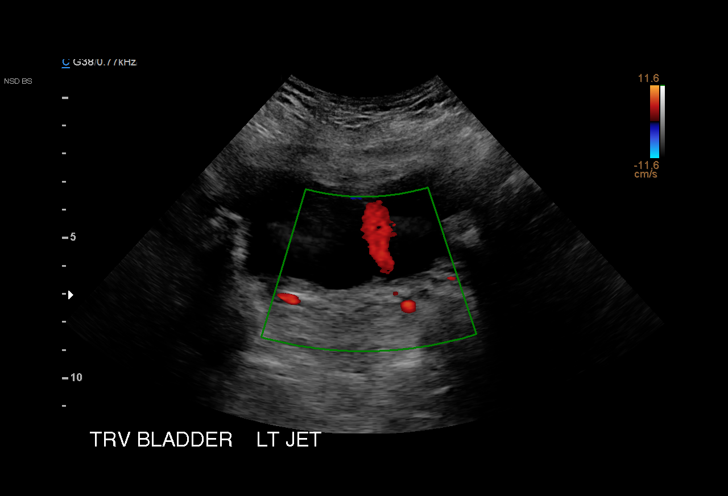

[15 of 25 positions shown; findings below may reference images not displayed]

FINDINGS: Right Kidney:

Renal measurements: 12.0 x 6.6 x 7.2 cm = volume: 290 mL .
Echogenicity within normal limits. Moderate hydronephrosis is noted.
No shadowing stones are seen.

Left Kidney:

Renal measurements: Somewhat difficult to assess. 8.4 x 4.0 x 4.6 cm
= volume: 81 mL. No definite hydronephrosis seen.

Bladder:

Appears normal for degree of bladder distention. Bilateral ureteral
jets are visualized.

Other:

None.
IMPRESSION: Moderate right-sided hydronephrosis.  No shadowing stones.

Bilateral ureteral jets are noted.

## 2021-04-07 ENCOUNTER — Ambulatory Visit: Payer: Medicaid Other
